# Patient Record
Sex: Female | Born: 1975
Health system: Southern US, Community
[De-identification: ages and names within clinical notes are randomized; demographics above are authoritative.]

## PROBLEM LIST (undated history)

## (undated) ENCOUNTER — Inpatient Hospital Stay (HOSPITAL_COMMUNITY): Payer: Self-pay

## (undated) DIAGNOSIS — F419 Anxiety disorder, unspecified: Secondary | ICD-10-CM

## (undated) DIAGNOSIS — F32A Depression, unspecified: Secondary | ICD-10-CM

## (undated) DIAGNOSIS — I10 Essential (primary) hypertension: Secondary | ICD-10-CM

## (undated) DIAGNOSIS — R7303 Prediabetes: Secondary | ICD-10-CM

## (undated) DIAGNOSIS — F329 Major depressive disorder, single episode, unspecified: Secondary | ICD-10-CM

## (undated) DIAGNOSIS — Z789 Other specified health status: Secondary | ICD-10-CM

## (undated) HISTORY — PX: BREAST REDUCTION SURGERY: SHX8

---
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
## 1998-06-09 ENCOUNTER — Other Ambulatory Visit: Admission: RE | Admit: 1998-06-09 | Discharge: 1998-06-09 | Payer: Self-pay | Admitting: *Deleted

## 2000-01-20 ENCOUNTER — Emergency Department (HOSPITAL_COMMUNITY): Admission: EM | Admit: 2000-01-20 | Discharge: 2000-01-20 | Payer: Self-pay | Admitting: Emergency Medicine

## 2004-05-02 ENCOUNTER — Ambulatory Visit (HOSPITAL_COMMUNITY): Admission: RE | Admit: 2004-05-02 | Discharge: 2004-05-02 | Payer: Self-pay | Admitting: Specialist

## 2004-05-02 ENCOUNTER — Ambulatory Visit (HOSPITAL_BASED_OUTPATIENT_CLINIC_OR_DEPARTMENT_OTHER): Admission: RE | Admit: 2004-05-02 | Discharge: 2004-05-02 | Payer: Self-pay | Admitting: Specialist

## 2005-11-22 ENCOUNTER — Ambulatory Visit: Payer: Self-pay | Admitting: Gynecology

## 2005-11-22 ENCOUNTER — Inpatient Hospital Stay (HOSPITAL_COMMUNITY): Admission: AD | Admit: 2005-11-22 | Discharge: 2005-11-22 | Payer: Self-pay | Admitting: Gynecology

## 2011-03-07 HISTORY — PX: LAPAROSCOPIC TUBAL LIGATION: SUR803

## 2011-08-16 ENCOUNTER — Inpatient Hospital Stay (HOSPITAL_COMMUNITY): Payer: 59

## 2011-08-16 ENCOUNTER — Inpatient Hospital Stay (HOSPITAL_COMMUNITY)
Admission: AD | Admit: 2011-08-16 | Discharge: 2011-08-16 | Disposition: A | Discharge status: Home / Self Care | Payer: 59 | Source: Ambulatory Visit | Attending: Obstetrics & Gynecology | Admitting: Obstetrics & Gynecology

## 2011-08-16 ENCOUNTER — Encounter (HOSPITAL_COMMUNITY): Payer: Self-pay | Admitting: *Deleted

## 2011-08-16 DIAGNOSIS — O0281 Inappropriate change in quantitative human chorionic gonadotropin (hCG) in early pregnancy: Secondary | ICD-10-CM

## 2011-08-16 DIAGNOSIS — O368390 Maternal care for abnormalities of the fetal heart rate or rhythm, unspecified trimester, not applicable or unspecified: Secondary | ICD-10-CM | POA: Insufficient documentation

## 2011-08-16 DIAGNOSIS — O280 Abnormal hematological finding on antenatal screening of mother: Secondary | ICD-10-CM

## 2011-08-16 DIAGNOSIS — R109 Unspecified abdominal pain: Secondary | ICD-10-CM | POA: Insufficient documentation

## 2011-08-16 DIAGNOSIS — Z3490 Encounter for supervision of normal pregnancy, unspecified, unspecified trimester: Secondary | ICD-10-CM

## 2011-08-16 HISTORY — DX: Other specified health status: Z78.9

## 2011-08-16 LAB — URINALYSIS, ROUTINE W REFLEX MICROSCOPIC
Bilirubin Urine: NEGATIVE
Glucose, UA: NEGATIVE mg/dL
Hgb urine dipstick: NEGATIVE
Ketones, ur: NEGATIVE mg/dL
Leukocytes, UA: NEGATIVE
Protein, ur: NEGATIVE mg/dL
pH: 6 (ref 5.0–8.0)

## 2011-08-16 NOTE — MAU Provider Note (Signed)
History     CSN: [id number]  Arrival date and time: 08/16/11 6213   First Provider Initiated Contact with Patient 08/16/11 2046      Chief Complaint  Patient presents with  . Abdominal Cramping   HPI Kaitlin Massey is 36 y.o. [id number] [redacted]w[redacted]d weeks presenting with concerns of decreasing BHCGs.  She is patient at [facility] in [place].  Was seen at their office today for repeat lab work.  BHCG 5/24 5,004, 2 days ago 5,003 and today 4800.  States she was told their was a heartbeat on the ultrasound 6/10 and not one today.  She is here for another opinion.  She is upset because they gave her a choice of D&C or "let nature take care of itself".  Denies vaginal bleeding. Having mild cramping.     Past Medical History  Diagnosis Date  . No pertinent past medical history     Past Surgical History  Procedure Date  . Breast reduction surgery     Family History  Problem Relation Age of Onset  . Hypertension Mother   . Diabetes Mother   . Stroke Mother   . Heart disease Mother   . Diabetes Father     History  Substance Use Topics  . Smoking status: Current Everyday Smoker -- 0.5 packs/day  . Smokeless tobacco: Not on file  . Alcohol Use: No    Allergies: No Known Allergies  Prescriptions prior to admission  Medication Sig Dispense Refill  . Prenatal Vit-Fe Fumarate-FA (PRENATAL MULTIVITAMIN) TABS Take 1 tablet by mouth daily.        Review of Systems  Constitutional: Negative for fever.  Respiratory: Negative.   Cardiovascular: Negative.   Gastrointestinal: Negative for abdominal pain.  Genitourinary:       Negative for bleeding   Physical Exam   Blood pressure 117/72, pulse 91, temperature 98.9 F (37.2 C), temperature source Oral, resp. rate 20, height 4' 11.5" (1.511 m), weight 84.369 kg (186 lb), SpO2 100.00%.  Physical Exam  Constitutional: She is oriented to person, place, and time. She appears well-developed and well-nourished.       Upset, tearful    HENT:  Head: Normocephalic.  Cardiovascular: Normal rate.   Respiratory: Effort normal.  Genitourinary:       Offered and declined by patient.  Neurological: She is alert and oriented to person, place, and time.  Skin: Skin is warm and dry.  Psychiatric: She has a normal mood and affect. Her behavior is normal. Thought content normal.   Results for orders placed during the hospital encounter of 08/16/11 (from the past 24 hour(s))  URINALYSIS, ROUTINE W REFLEX MICROSCOPIC     Status: Normal   Collection Time   08/16/11  7:52 PM      Component Value Range   Color, Urine YELLOW  YELLOW   APPearance CLEAR  CLEAR   Specific Gravity, Urine 1.010  1.005 - 1.030   pH 6.0  5.0 - 8.0   Glucose, UA NEGATIVE  NEGATIVE mg/dL   Hgb urine dipstick NEGATIVE  NEGATIVE   Bilirubin Urine NEGATIVE  NEGATIVE   Ketones, ur NEGATIVE  NEGATIVE mg/dL   Protein, ur NEGATIVE  NEGATIVE mg/dL   Urobilinogen, UA 0.2  0.0 - 1.0 mg/dL   Nitrite NEGATIVE  NEGATIVE   Leukocytes, UA NEGATIVE  NEGATIVE  POCT PREGNANCY, URINE     Status: Abnormal   Collection Time   08/16/11  8:01 PM      Component Value Range  Preg Test, Ur POSITIVE (*) NEGATIVE  Clinical Data: Cramping, decreasing beta HCG level.  OBSTETRIC <14 WK ULTRASOUND  Technique: Transabdominal ultrasound was performed for evaluation  of the gestation as well as the maternal uterus and adnexal  regions.  Comparison: None.  Intrauterine gestational sac: Visualized/normal in shape.  Yolk sac: Identified  Embryo: Not identified  Cardiac Activity: Not applicable  MSD: 12 mm 6w 0d  [place] EDC: 04/10/2012  Maternal uterus/Adnexae:  No subchorionic hemorrhage. Normal sonographic appearances to the  ovaries. Corpus luteal cyst noted on the left. No free fluid.  IMPRESSION:  Gestational sac with yolk sac. No embryo at this time. May be  secondary to the early timing of the examination however recommend  correlation with serial beta HCG and ultrasound  follow-up as  warranted as anembryonic pregnancy is not excluded.  Original Report Authenticated By: Waneta Martins,   MAU Course  Procedures  MDM Discussed at length the information given by the patient and the ultrasound findings tonight with the patient and her husband.   Explained that the numbers she gave are worrisome for a pregnancy that is not developing normally but the U/S tonight shows a GS with YS and that is assures [place] that the pregnancy is not in the fallopian tube.  Explained the expected progression of BHCG in normally developing embryos.  She declines exam because "I just wanted a second opinion if there was a heartbeat".  Explained embryo was not seen on ultrasound.  Assessment and Plan  A:  Non reassuring BHCGs at her doctor's office      IUGS without FP or Cardiac activity  P:  Encouraged patient to call her doctor's office in the am and schedule return appointment      Miscarriage precautions given.  Ikeya Brockel,EVE M 08/16/2011, 8:49 PM

## 2011-08-16 NOTE — Discharge Instructions (Signed)
Threatened Miscarriage  Bleeding during the first 20 weeks of pregnancy is common. This is sometimes called a threatened miscarriage. This is a pregnancy that is threatening to end before the twentieth week of pregnancy. Often this bleeding stops with bed rest or decreased activities as suggested by your caregiver and the pregnancy continues without any more problems. You may be asked to not have sexual intercourse, have orgasms or use tampons until further notice. Sometimes a threatened miscarriage can progress to a complete or incomplete miscarriage. This may or may not require further treatment. Some miscarriages occur before a woman misses a menstrual period and knows she is pregnant.  Miscarriages occur in 15 to 20% of all pregnancies and usually occur during the first 13 weeks of the pregnancy. The exact cause of a miscarriage is usually never known. A miscarriage is natures way of ending a pregnancy that is abnormal or would not make it to term. There are some things that may put you at risk to have a miscarriage, such as:   Hormone problems.   Infection of the uterus or cervix.   Chronic illness, diabetes for example, especially if it is not controlled.   Abnormal shaped uterus.   Fibroids in the uterus.   Incompetent cervix (the cervix is too weak to hold the baby).   Smoking.   Drinking too much alcohol. It's best not to drink any alcohol when you are pregnant.   Taking illegal drugs.  TREATMENT   When a miscarriage becomes complete and all products of conception (all the tissue in the uterus) have been passed, often no treatment is needed. If you think you passed tissue, save it in a container and take it to your doctor for evaluation. If the miscarriage is incomplete (parts of the fetus or placenta remain in the uterus), further treatment may be needed. The most common reason for further treatment is continued bleeding (hemorrhage) because pregnancy tissue did not pass out of the uterus. This  often occurs if a miscarriage is incomplete. Tissue left behind may also become infected. Treatment usually is dilatation and curettage (the removal of the remaining products of pregnancy. This can be done by a simple sucking procedure (suction curettage) or a simple scraping of the inside of the uterus. This may be done in the hospital or in the caregiver's office. This is only done when your caregiver knows that there is no chance for the pregnancy to proceed to term. This is determined by physical examination, negative pregnancy test, falling pregnancy hormone count and/or, an ultrasound revealing a dead fetus.  Miscarriages are often a very emotional time for prospective mothers and fathers. This is not you or your partners fault. It did not occur because of an inadequacy in you or your partner. Nearly all miscarriages occur because the pregnancy has started off wrongly. At least half of these pregnancies have a chromosomal abnormality. It is almost always not inherited. Others may have developmental problems with the fetus or placenta. This does not always show up even when the products miscarried are studied under the microscope. The miscarriage is nearly always not your fault and it is not likely that you could have prevented it from happening. If you are having emotional and grieving problems, talk to your health care provider and even seek counseling, if necessary, before getting pregnant again. You can begin trying for another pregnancy as soon as your caregiver says it is OK.  HOME CARE INSTRUCTIONS    Your caregiver may order   bed rest depending on how much bleeding and cramping you are having. You may be limited to only getting up to go to the bathroom. You may be allowed to continue light activity. You may need to make arrangements for the care of your other children and for any other responsibilities.   Keep track of the number of pads you use each day, how often you have to change pads and how  saturated (soaked) they are. Record this information.   DO NOT USE TAMPONS. Do not douche, have sexual intercourse or orgasms until approved by your caregiver.   You may receive a follow up appointment for re-evaluation of your pregnancy and a repeat blood test. Re-evaluation often occurs after 2 days and again in 4 to 6 weeks. It is very important that you follow-up in the recommended time period.   If you are Rh negative and the father is Rh positive or you do not know the fathers' blood type, you may receive a shot (Rh immune globulin) to help prevent abnormal antibodies that can develop and affect the baby in any future pregnancies.  SEEK IMMEDIATE MEDICAL CARE IF:   You have severe cramps in your stomach, back, or abdomen.   You have a sudden onset of severe pain in the lower part of your abdomen.   You develop chills.   You run an unexplained temperature of 101 F (38.3 C) or higher.   You pass large clots or tissue. Save any tissue for your caregiver to inspect.   Your bleeding increases or you become light-headed, weak, or have fainting episodes.   You have a gush of fluid from your vagina.   You pass out. This could mean you have a tubal (ectopic) pregnancy.  Document Released: 02/20/2005 Document Revised: 02/09/2011 Document Reviewed: 10/07/2007  [employer] Patient Information 2012 [employer].

## 2011-08-16 NOTE — MAU Note (Signed)
At md office  On Monday  , had u/s and BHCG , had heartbeat and HCG level of 5003, today HCG is 4800. Office called with results and options, pt wants to make sure there is no heartbeat at this time. Is having a lot of cramping, but no bleeding.

## 2011-08-16 NOTE — MAU Note (Signed)
Pt states she has had a positive pregnancy  Test and started having blood work to follow pregnancy because LMP and Utrasound were not matching up. Pt states she has continues to follow up with small rises in hormone levels, but was told by the utrasonographer that there was a heart beat. Pt states that she has been told by her private MD that she needs to make a decision about pregnancy. Pt not comfortable with results, because of heartbeat.

## 2011-09-01 ENCOUNTER — Ambulatory Visit: Payer: Self-pay | Admitting: Obstetrics and Gynecology

## 2011-09-01 LAB — CBC
HCT: 41.4 % (ref 35.0–47.0)
HGB: 13.8 g/dL (ref 12.0–16.0)

## 2013-07-31 IMAGING — US US OB COMP LESS 14 WK
1 series · 14 of 28 positions shown · non-contrast
Comparison: None.

CLINICAL DATA: Cramping, decreasing beta HCG level.

OBSTETRIC <14 WK ULTRASOUND
TECHNIQUE: Transabdominal ultrasound was performed for evaluation
of the gestation as well as the maternal uterus and adnexal
regions.

[Series 1: us ob comp less 14 wks · 14 of 50 slices shown]
[im 2/50]
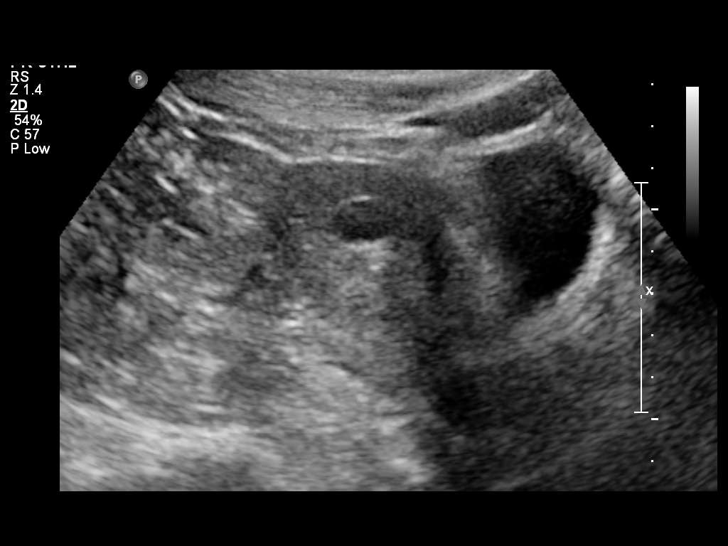
[im 6/50]
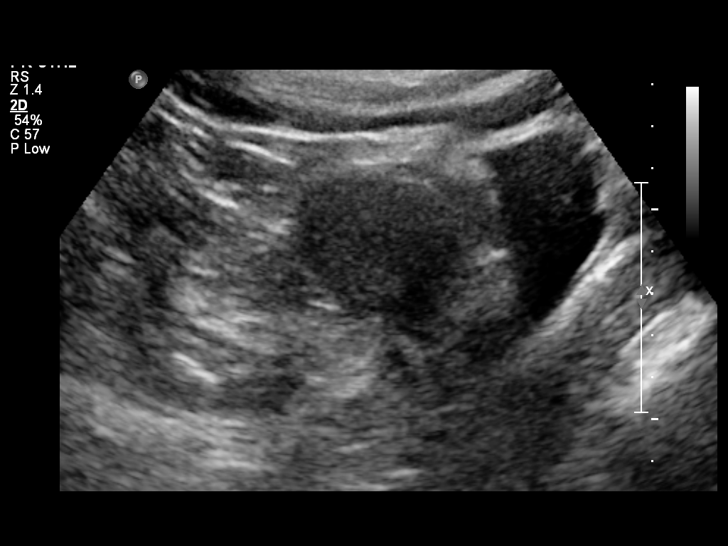
[im 10/50]
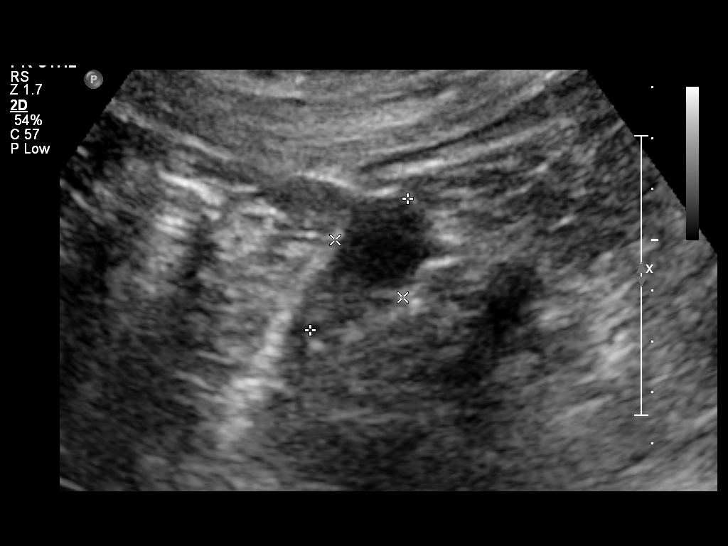
[im 13/50]
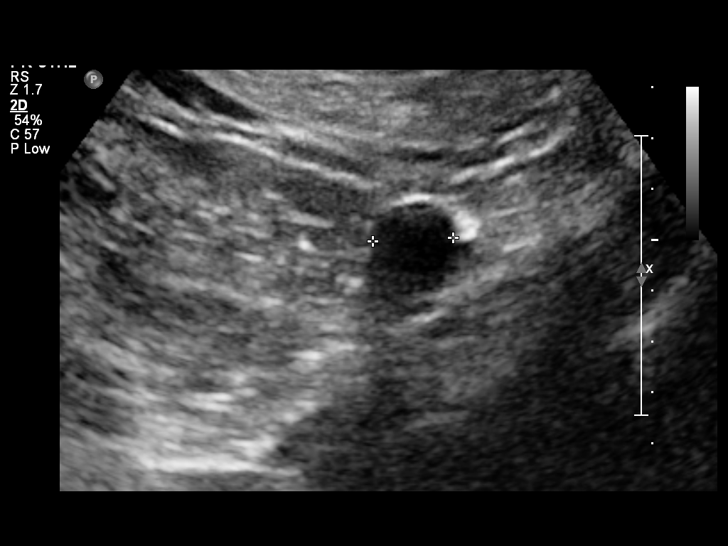
[im 17/50]
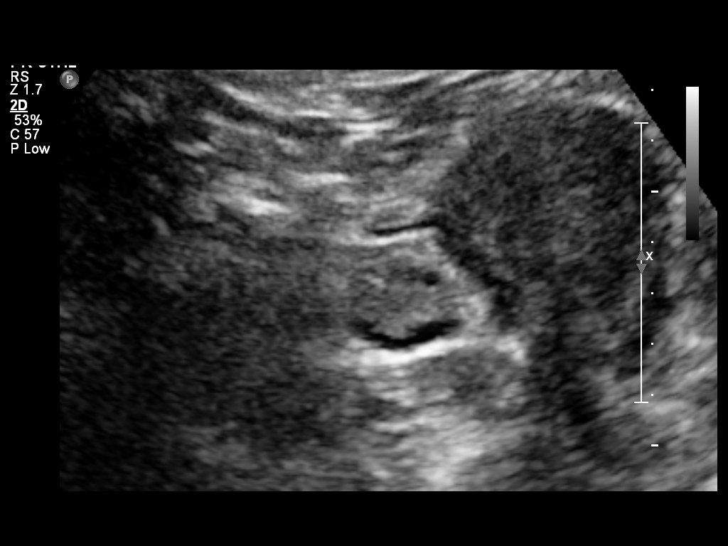
[im 20/50]
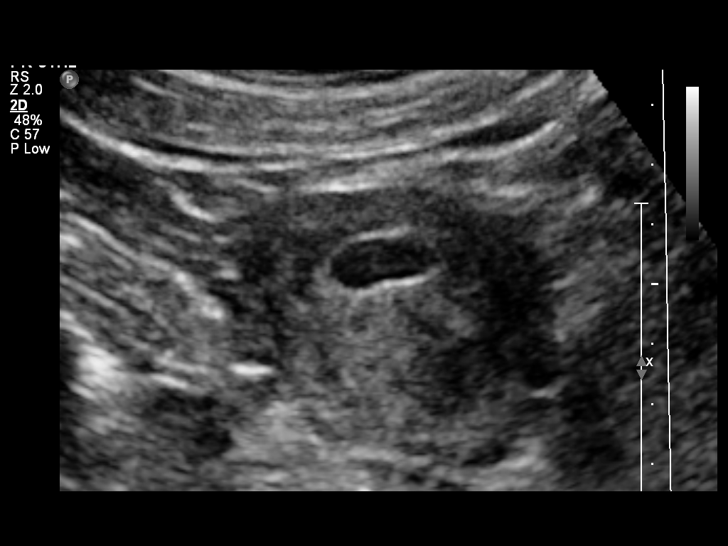
[im 24/50]
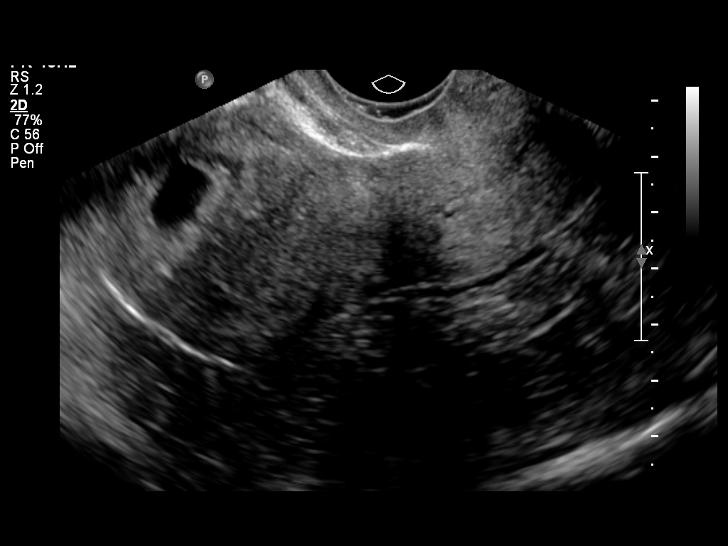
[im 28/50]
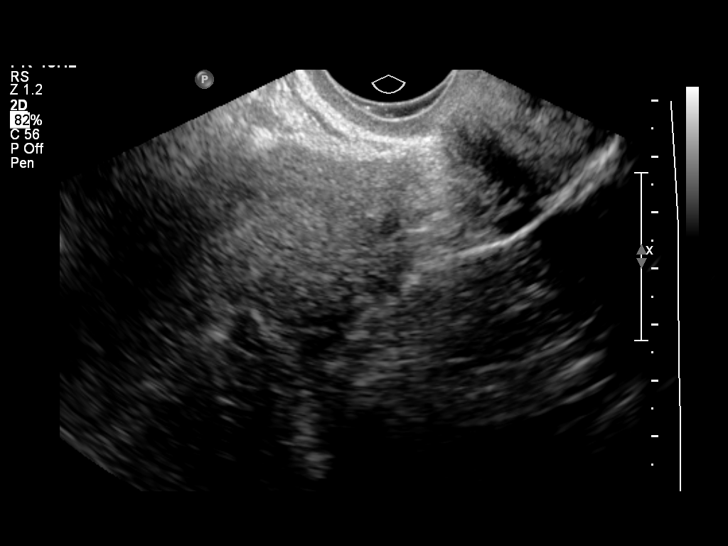
[im 31/50]
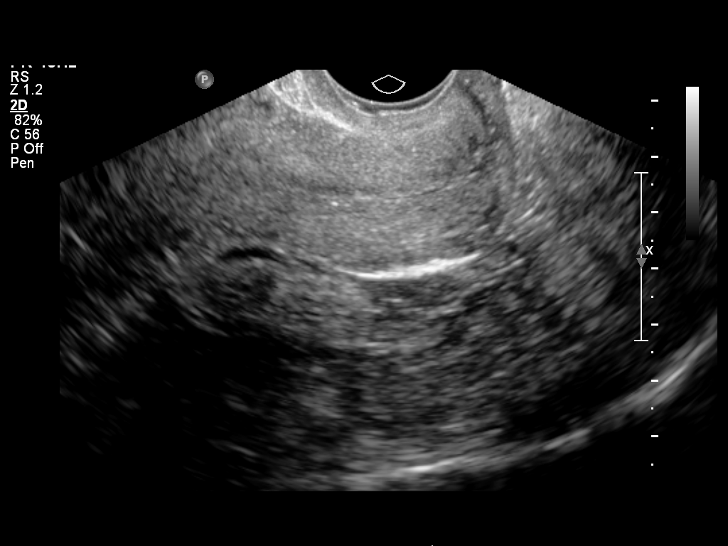
[im 35/50]
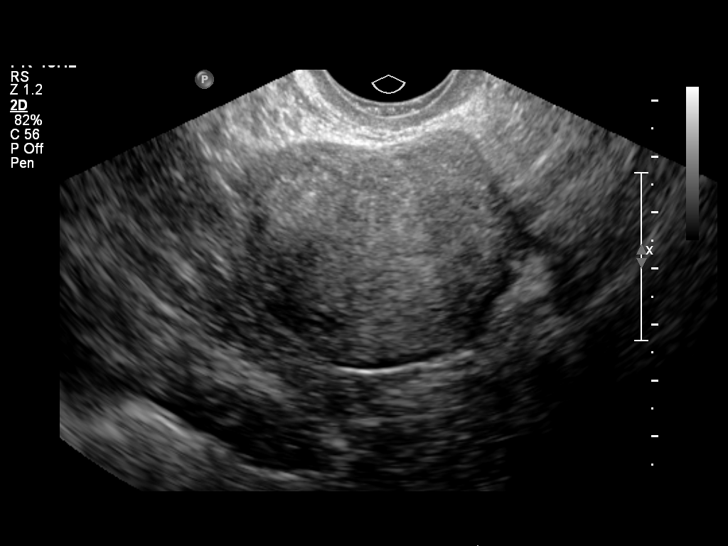
[im 39/50]
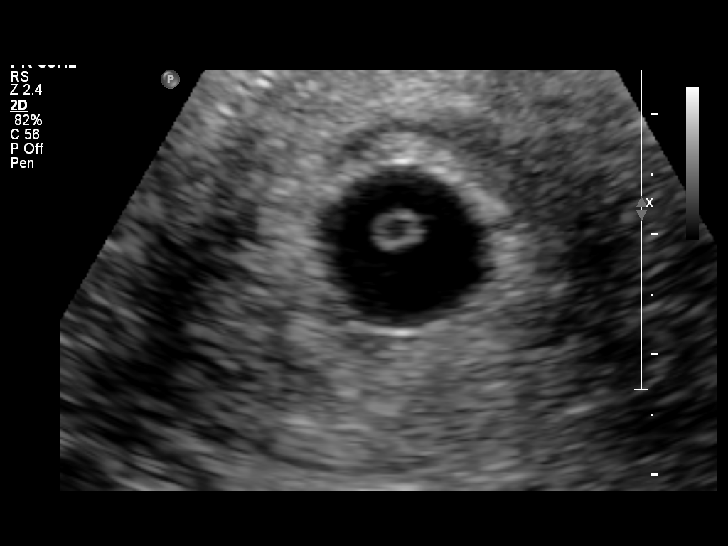
[im 42/50]
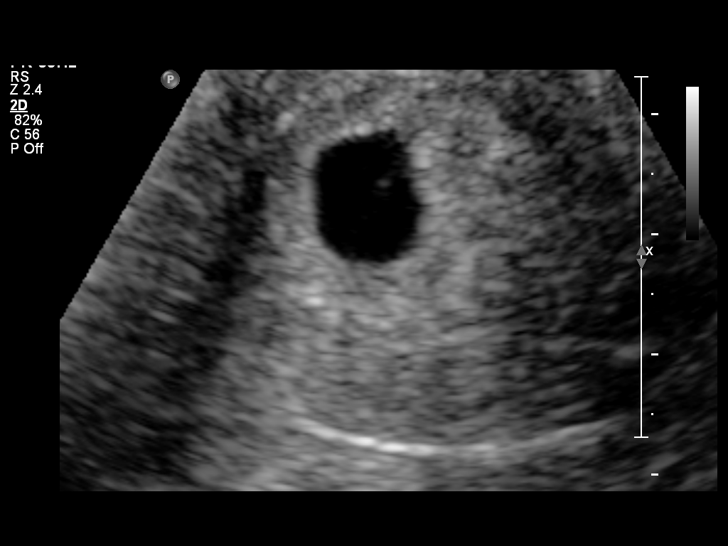
[im 46/50]
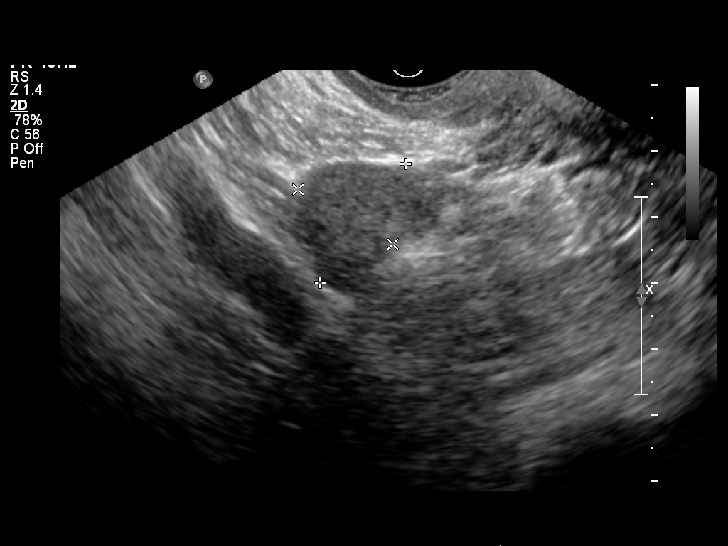
[im 50/50]
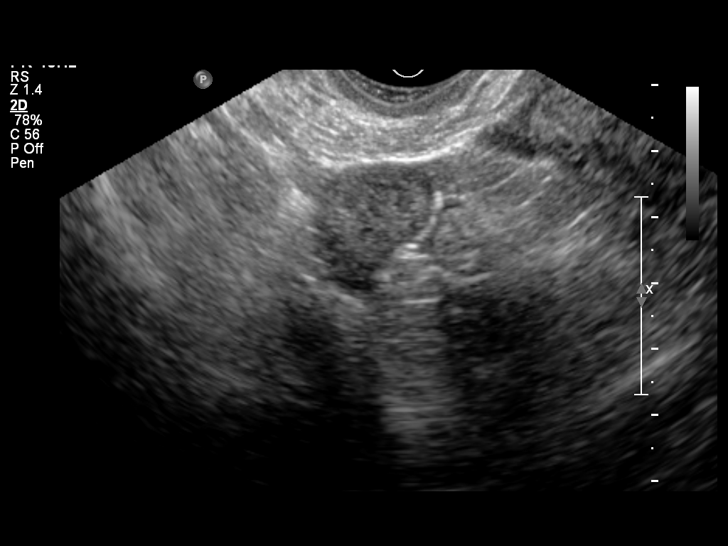

[14 of 28 positions shown; findings below may reference images not displayed]

Intrauterine gestational sac: Visualized/normal in shape.
Yolk sac: Identified
Embryo: Not identified
Cardiac Activity: Not applicable

MSD: 12 mm  6w  0d
US EDC: 04/10/2012

Maternal uterus/Adnexae:
No subchorionic hemorrhage.  Normal sonographic appearances to the
ovaries.  Corpus luteal cyst noted on the left.  No free fluid.
IMPRESSION: Gestational sac with yolk sac.  No embryo at this time.  May be
secondary to the early timing of the examination however recommend
correlation with serial beta HCG and ultrasound follow-up as
warranted as anembryonic pregnancy is not excluded.

## 2014-01-05 ENCOUNTER — Encounter (HOSPITAL_COMMUNITY): Payer: Self-pay | Admitting: *Deleted

## 2014-04-02 ENCOUNTER — Ambulatory Visit: Payer: Self-pay | Admitting: Obstetrics and Gynecology

## 2014-04-02 LAB — HEMOGLOBIN: HGB: 14.1 g/dL (ref 12.0–16.0)

## 2014-04-02 LAB — HEMATOCRIT: HCT: 42.7 % (ref 35.0–47.0)

## 2014-04-09 ENCOUNTER — Ambulatory Visit: Payer: Self-pay | Admitting: Obstetrics and Gynecology

## 2014-06-28 NOTE — Op Note (Signed)
PATIENT NAME:  Kaitlin Massey, Kaitlin Massey MR#:  [id number] DATE OF BIRTH:  1975/05/02  DATE OF PROCEDURE:  09/01/2011  PREOPERATIVE DIAGNOSIS: Incomplete abortion, desires sterility.   POSTOPERATIVE DIAGNOSIS:  Incomplete abortion, desires sterility.   PROCEDURE: Laparoscopic tubal sterilization with Falope rings, suction dilatation and curettage.   SURGEON: Senaida LangeLashawn Weaver-Lee, M.D.   ANESTHESIA: General.   ESTIMATED BLOOD LOSS: Minimal.  OPERATIVE FLUIDS: 1,200 mL.  COMPLICATIONS: None.   FINDINGS: Normal appearing uterus, tubes, and ovaries, products of conception.  SPECIMEN: Endometrial curettings.   INDICATIONS: The patient is a 39 year old who had been monitored for vaginal bleeding with positive pregnancy test, was subsequently found to not have a viable pregnancy. The day prior to admission for surgery she had some vaginal bleeding and desired surgical management. The patient also desired sterility. Risks, benefits, indications, and alternatives of the procedure were explained and informed consent was obtained.   PROCEDURE: The patient was taken to the Operating Room with IV fluids running. She was prepped and draped in the usual sterile fashion in [place] stirrups. A speculum was placed inside the vagina. The anterior lip of the cervix was grasped with a single-tooth tenaculum and a Hulka tenaculum was placed for uterine manipulation. The bladder was drained of urine. Attention was turned to the patient's abdomen where an infraumbilical region was injected with 0.5% Sensorcaine and a 5 mm incision was made. The Veress needle was placed and the abdomen was insufflated with CO2 gas. The Veress needle was removed and the abdomen was entered under direct visualization using a 5 mm X-Cel trocar. An additional 8 mm X-Cel trocar was placed in the patient's lateral left side. The Falope ring applicator was used to apply rings to the tubes bilaterally. This portion of the procedure was ended and all  instrumentation was removed from her abdomen. The incisions were closed with Dermabond.   Attention was turned to the patient's vagina where a speculum was placed. The Hulka tenaculum was removed. The uterus sounded to 8 cm. The cervix was dilated using Hank dilators. A #8 suction curette was placed and products of conception were removed. Additional products were removed via curettage with a serrated edge banjo curette. The procedure was ended. All instrumentation was removed from the patient's vagina. Sponge and instrument counts were correct x2. The patient was awakened and she was taken to recovery room in stable condition.   ____________________________ Sonda PrimesLashawn A. Patton SallesWeaver-Lee, MD law:ap D: 09/01/2011 19:27:01 ET T: 09/02/2011 09:51:47 ET JOB#: [id number]  cc: Flint MelterLashawn A. Patton SallesWeaver-Lee, MD, <Dictator> Sonda PrimesLASHAWN A WEAVER LEE MD ELECTRONICALLY SIGNED 09/04/2011 17:17

## 2014-06-29 LAB — SURGICAL PATHOLOGY

## 2014-07-05 NOTE — Op Note (Signed)
PATIENT NAME:  Kaitlin BertholdJENNINGS, Leonie M MR#:  [id number] DATE OF BIRTH:  September 03, 1975  DATE OF PROCEDURE:  04/09/2014  ATTENDING AND DICTATING PHYSICIAN: [place] Bingharlie Graceann Boileau, MD  PREOPERATIVE DIAGNOSES: Abnormal uterine bleeding, endometrial polyp.   POSTOPERATIVE DIAGNOSES:  1. Abnormal uterine bleeding. 2. Uterine septum.  3.  Endometrial polyp  OPERATION: Hysteroscopy, Dilation and Curettage   ANESTHESIA: General and paracervical block.   ESTIMATED BLOOD LOSS: 25 mL.   URINE OUTPUT: 50 mL.   Intravenous fluids: 800 mL of crystalloid.   COMPLICATIONS: Unable to do a NovaSure endometrial ablation due to a uterine septum.   VENOUS THROMBOEMBOLISM PROPHYLAXIS: SCDs applied to bilateral lower extremities.   ANTIBIOTICS: Not indicated.   SPECIMENS: Endometrial curettings to pathology.   FINDINGS: The uterus sounded to 8.5 cm. The cervical canal length was 4.5 and the uterine cavity length was 4 cm. On diagnostic hysteroscopy, a uterine septum was seen. It appeared to be approximately 1 cm in the medial and midline portion of the uterus at the fundus. The right tubal ostia was then seen. The left tubal ostia was not definitively seen. Endometrial polyp was noted near the septum, as well as a fluffy and polypoid benign-appearing material in the endometrial cavity with a normal endocervical canal appeared. I would NOT recommend an IUD for symptomatic treatment and this was conveyed to the patient post operatively.  DESCRIPTION OF PROCEDURE: The patient was taken to the Operating Room, where the anesthesia was administered. She was prepped and draped in normal sterile fashion in dorsal lithotomy position in [place] stirrups. The bladder was then drained with an in and out Foley catheter. The speculum was then inserted and a single-toothed tenaculum applied and the endocervical canal measured to 4.5 cm and the uterine cavity length measured to 4 cm. Next, the canal was then gradually dilated up to 6 mm and  the MyoSure hysteroscope was then introduced with the above-noted findings. A dilation and curettage was then performed. Next, the NovaSure was then opened outside of the body with normal checkpoints and no malfunctions. Then the NovaSure was then retracted back into the device and inserted into the uterine cavity and opened, and going through the normal instructions, the cavity width was not able to get past 2.5 cm. Next, the NovaSure was then retracted back into the device and removed from the patient. Hysteroscope was then performed with similar above-noted findings and another D and C was then performed to try to get rid of more of the fluffy endometrial material, and the NovaSure was then reinserted into the uterus and opened, but unfortunately, unable to get past 2.5 cm, which was felt to be due to uterine septum. Given the fact the patient does not desire any more children and had a bilateral tubal, and it was felt that the endometrial polyp was causing or abnormal uterine bleeding, the decision was made to abort procedure and not to resect the septum.   All instruments were removed from the patient after excellent hemostasis was noted. Sponge, lap, needle, and instrument counts were correct x2.   ____________________________ [place] Bingharlie Inioluwa Boulay, MD cp:ap D: 04/11/2014 09:05:52 ET T: 04/11/2014 11:52:49 ET JOB#: [id number]  cc: [place] Bingharlie Ayline Dingus, MD, <Dictator> [place] BingHARLIE Kyriaki Moder MD ELECTRONICALLY SIGNED 04/26/2014 12:29

## 2016-01-07 ENCOUNTER — Encounter (HOSPITAL_BASED_OUTPATIENT_CLINIC_OR_DEPARTMENT_OTHER): Payer: Self-pay

## 2016-01-07 ENCOUNTER — Ambulatory Visit (HOSPITAL_BASED_OUTPATIENT_CLINIC_OR_DEPARTMENT_OTHER): Admit: 2016-01-07 | Payer: Self-pay | Admitting: Orthopedic Surgery

## 2016-01-07 SURGERY — ARTHROSCOPY, KNEE
Anesthesia: Choice | Laterality: Right

## 2017-07-05 DIAGNOSIS — E039 Hypothyroidism, unspecified: Secondary | ICD-10-CM | POA: Diagnosis not present

## 2017-07-05 DIAGNOSIS — E7800 Pure hypercholesterolemia, unspecified: Secondary | ICD-10-CM | POA: Diagnosis not present

## 2017-07-05 DIAGNOSIS — S30860A Insect bite (nonvenomous) of lower back and pelvis, initial encounter: Secondary | ICD-10-CM | POA: Diagnosis not present

## 2017-07-05 DIAGNOSIS — L299 Pruritus, unspecified: Secondary | ICD-10-CM | POA: Diagnosis not present

## 2017-07-05 DIAGNOSIS — Z139 Encounter for screening, unspecified: Secondary | ICD-10-CM | POA: Diagnosis not present

## 2017-07-05 DIAGNOSIS — E119 Type 2 diabetes mellitus without complications: Secondary | ICD-10-CM | POA: Diagnosis not present

## 2018-10-24 DIAGNOSIS — Z6841 Body Mass Index (BMI) 40.0 and over, adult: Secondary | ICD-10-CM | POA: Diagnosis not present

## 2018-10-24 DIAGNOSIS — Z1331 Encounter for screening for depression: Secondary | ICD-10-CM | POA: Diagnosis not present

## 2018-10-24 DIAGNOSIS — F418 Other specified anxiety disorders: Secondary | ICD-10-CM | POA: Diagnosis not present

## 2018-10-24 DIAGNOSIS — Z1231 Encounter for screening mammogram for malignant neoplasm of breast: Secondary | ICD-10-CM | POA: Diagnosis not present

## 2019-01-24 DIAGNOSIS — F418 Other specified anxiety disorders: Secondary | ICD-10-CM | POA: Diagnosis not present

## 2019-01-24 DIAGNOSIS — Z79899 Other long term (current) drug therapy: Secondary | ICD-10-CM | POA: Diagnosis not present

## 2019-01-24 DIAGNOSIS — Z87891 Personal history of nicotine dependence: Secondary | ICD-10-CM | POA: Diagnosis not present

## 2019-01-24 DIAGNOSIS — I10 Essential (primary) hypertension: Secondary | ICD-10-CM | POA: Diagnosis not present

## 2019-05-29 DIAGNOSIS — F419 Anxiety disorder, unspecified: Secondary | ICD-10-CM | POA: Diagnosis not present

## 2019-05-29 DIAGNOSIS — Z13220 Encounter for screening for lipoid disorders: Secondary | ICD-10-CM | POA: Diagnosis not present

## 2019-05-29 DIAGNOSIS — Z87891 Personal history of nicotine dependence: Secondary | ICD-10-CM | POA: Diagnosis not present

## 2019-05-29 DIAGNOSIS — Z0000 Encounter for general adult medical examination without abnormal findings: Secondary | ICD-10-CM | POA: Diagnosis not present

## 2019-05-29 DIAGNOSIS — Z6837 Body mass index (BMI) 37.0-37.9, adult: Secondary | ICD-10-CM | POA: Diagnosis not present

## 2019-05-29 DIAGNOSIS — B977 Papillomavirus as the cause of diseases classified elsewhere: Secondary | ICD-10-CM | POA: Diagnosis not present

## 2019-07-01 DIAGNOSIS — Z6841 Body Mass Index (BMI) 40.0 and over, adult: Secondary | ICD-10-CM | POA: Diagnosis not present

## 2019-07-01 DIAGNOSIS — N939 Abnormal uterine and vaginal bleeding, unspecified: Secondary | ICD-10-CM | POA: Diagnosis not present

## 2019-07-25 DIAGNOSIS — Z6841 Body Mass Index (BMI) 40.0 and over, adult: Secondary | ICD-10-CM | POA: Diagnosis not present

## 2019-07-25 DIAGNOSIS — E669 Obesity, unspecified: Secondary | ICD-10-CM | POA: Diagnosis not present

## 2019-07-25 DIAGNOSIS — N939 Abnormal uterine and vaginal bleeding, unspecified: Secondary | ICD-10-CM | POA: Diagnosis not present

## 2019-08-28 DIAGNOSIS — F419 Anxiety disorder, unspecified: Secondary | ICD-10-CM | POA: Diagnosis not present

## 2019-08-28 DIAGNOSIS — E7800 Pure hypercholesterolemia, unspecified: Secondary | ICD-10-CM | POA: Diagnosis not present

## 2019-08-28 DIAGNOSIS — Z139 Encounter for screening, unspecified: Secondary | ICD-10-CM | POA: Diagnosis not present

## 2019-08-28 DIAGNOSIS — I10 Essential (primary) hypertension: Secondary | ICD-10-CM | POA: Diagnosis not present

## 2019-09-02 DIAGNOSIS — Z79899 Other long term (current) drug therapy: Secondary | ICD-10-CM | POA: Diagnosis not present

## 2019-09-02 DIAGNOSIS — D519 Vitamin B12 deficiency anemia, unspecified: Secondary | ICD-10-CM | POA: Diagnosis not present

## 2019-09-02 DIAGNOSIS — Z6841 Body Mass Index (BMI) 40.0 and over, adult: Secondary | ICD-10-CM | POA: Diagnosis not present

## 2019-09-23 ENCOUNTER — Encounter (HOSPITAL_BASED_OUTPATIENT_CLINIC_OR_DEPARTMENT_OTHER): Payer: Self-pay | Admitting: Obstetrics & Gynecology

## 2019-09-23 NOTE — Progress Notes (Addendum)
COVID Vaccine Completed:None Date COVID Vaccine completed: COVID vaccine manufacturer: Pfizer    [facility]   PCP - Marin Comment, PA ([facility]) Cardiologist - N/A No stimulator in back  Chest x-ray -  EKG -  Stress Test -  ECHO -  Cardiac Cath -   Sleep Study -  CPAP -   Fasting Blood Sugar -  Checks Blood Sugar _____ times a day  Blood Thinner Instructions: Aspirin Instructions: Last Dose:  Complete ADL w/o SOB  Anesthesia review:   Patient denies shortness of breath, fever, cough and chest pain at PAT appointment   Patient verbalized understanding of instructions that were given to them at the PAT appointment. Patient was also instructed that they will need to review over the PAT instructions again at home before surgery.

## 2019-09-23 NOTE — H&P (Signed)
Kaitlin Massey [id number] h/o Laparoscopic BTL, Depression/anxiety, newly diagnosed HTN, prediabetes (diet management currently) tele visit on 7/20 for preop visit. Unable to be seen in person today due to work schedule, opted for tele visit.  Reports bleeding is still irregular - at least 2 periods a month with lots of cramping. Not taking progesterone, previously tried norethindrone without relief. Continues to have leakage with coughing/sneezing. Has not tried azo bladder control.  Workup: Pap smear: 05/29/2019 NILM, HPV HR positive, HPV 16/18/45 Negative   [facility] US 07/25/2019 [place] for AUB: Anteverted Uterus 7.71x4.63x4.17cm, endometrium 30mm and normal morphology, uterus heterogenous, echogenic area in myometrium consistent with adenomyosis. Bilateral ovaries/adnexa wnl.  Endometrial biopsy 07/25/2019 No hyperplasia or carcinoma. Inactive endometrium  07/25/2019 A1c 5.8%    Past Medical History:  Diagnosis Date  . Anxiety   . Depression   . Hypertension   . Prediabetes     Past Surgical History:  Procedure Laterality Date  . BREAST REDUCTION SURGERY    . LAPAROSCOPIC TUBAL LIGATION  2013    Family History  Problem Relation Age of Onset  . Hypertension Mother   . Diabetes Mother   . Stroke Mother   . Heart disease Mother   . Diabetes Father     Social History:  reports that she has been smoking. She has been smoking about 0.50 packs per day. She does not have any smokeless tobacco history on file. She reports that she does not drink alcohol and does not use drugs.  Allergies:  Allergies  Allergen Reactions  . Morphine Anaphylaxis    No medications prior to admission.    Review of Systems - per HPI Physical Exam Deferred for tele appointment.   Assessment/Plan: 44yo with AUB/SUI, plan for TVH/BS/TVT scheduled 10/07/2019 The nature of the procedure was discussed with the patient in detail. An informed discussion was held regarding the risks and  benefits of surgical intervention. Specifically, the patient was apprised of risks of pain, bleeding requiring blood transfusion, infection requiring antibiotics, injury to nearby organs (bowel, bladder, nerves, blood vessels, ureter), need for laparotomy to complete the operation, or failure to achieve desired results. She was informed of the low but real risk of these complications, and understands that the alternative is no surgery. An opportunity to ask questions was provided, and all questions were answered to the patient's satisfaction. Patient expresses understanding of these issues, and agrees to proceed with the plan outlined above. The expected post-operative recovery course was discussed with the patient, and post-operative instructions were reviewed. -Surgical history significant for Laparoscopic BTL at same time of D&C, miscarriage 2013 - does not know providers name.  -History of SVD x2, largest 7lb8oz -Would accept blood transfusion if necessary -Reviewed ERAS protocol -Plan to d/c home with catheter and f/u 2 days later for voiding trial.  2. Co-morbidities: Anxiety- takes alprazolam QD to BID Depression- takes sertraline QD Encourage smoking cessation(reports she has cut back) HTN and pre diabetes: saw PCP, working on weight loss by diet/exercise, prior to starting medications  Kaitlin Massey 09/23/2019, 3:11 PM

## 2019-09-23 NOTE — Patient Instructions (Addendum)
YOU ARE SCHEDULED FOR A COVID TEST 10-03-19 @ 3:15 PM. THIS TEST MUST BE DONE BEFORE SURGERY. GO TO  [address], [place], [id number] AND REMAIN IN YOUR CAR, THIS IS A DRIVE UP TEST. ONCE YOUR COVID TEST IS DONE PLEASE FOLLOW ALL THE QUARANTINE  INSTRUCTIONS GIVEN IN YOUR HANDOUT.      Your procedure is scheduled on 10-07-19   [facility]   Call this number if you have problems the morning of surgery  :[phone].   OUR ADDRESS IS [address].  WE ARE LOCATED IN [facility].  PLEASE BRING YOUR INSURANCE CARD AND PHOTO ID DAY OF SURGERY.  ONLY ONE PERSON ALLOWED IN FACILITY WAITING AREA.                                     REMEMBER: AFTER MIDNIGHT, YOU MAY HAVE A CLEAR LIQUID DIET UNTIL 9:15 am. AFTER 9:15 AM, NOTHING UNTIL AFTER SURGERY.   CLEAR LIQUID DIET   Foods Allowed                                                                     Foods Excluded  Coffee and tea, regular and decaf                             liquids that you cannot  Plain Jell-O any favor except red or purple                                           see through such as: Fruit ices (not with fruit pulp)                                     milk, soups, orange juice  Iced Popsicles                                    All solid food Carbonated beverages, regular and diet                                    Cranberry, grape and apple juices Sports drinks like Gatorade Lightly seasoned clear broth or consume(fat free) Sugar, honey syrup   _____________________________________________________________________      YOU MAY  BRUSH YOUR TEETH MORNING OF SURGERY AND RINSE YOUR MOUTH OUT, NO CHEWING GUM CANDY OR MINTS.   TAKE THESE MEDICATIONS MORNING OF SURGERY WITH A SIP OF WATER: Sertraline (Zoloft), and Alprazolam (Xanax)  IF YOU ARE SPENDING THE NIGHT AFTER SURGERY PLEASE BRING ALL YOUR PRESCRIPTION MEDICATIONS IN THEIR ORIGINAL  BOTTLES.   1 VISITOR IS ALLOWED IN WAITING ROOM ONLY DAY OF SURGERY.   NO VISITOR MAY SPEND THE NIGHT. VISITOR ARE ALLOWED TO  STAY UNTIL 800 PM.                                    DO NOT WEAR JEWERLY, MAKE UP, OR NAIL POLISH ON FINGERNAILS.  DO NOT WEAR LOTIONS, POWDERS, PERFUMES OR DEODORANT.  DO NOT SHAVE FOR 24 HOURS PRIOR TO DAY OF SURGERY.  CONTACTS, GLASSES, OR DENTURES MAY NOT BE WORN TO SURGERY.                                    [facility] IS NOT RESPONSIBLE  FOR ANY BELONGINGS.                                                                    Marland Kitchen                                      [facility] - Preparing for Surgery Before surgery, you can play an important role.  Because skin is not sterile, your skin needs to be as free of germs as possible.  You can reduce the number of germs on your skin by washing with CHG (chlorahexidine gluconate) soap before surgery.  CHG is an antiseptic cleaner which kills germs and bonds with the skin to continue killing germs even after washing. Please DO NOT use if you have an allergy to CHG or antibacterial soaps.  If your skin becomes reddened/irritated stop using the CHG and inform your nurse when you arrive at Short Stay. Do not shave (including legs and underarms) for at least 48 hours prior to the first CHG shower.  You may shave your face/neck. Please follow these instructions carefully:  1.  Shower with CHG Soap the night before surgery and the  morning of Surgery.  2.  If you choose to wash your hair, wash your hair first as usual with your  normal  shampoo.  3.  After you shampoo, rinse your hair and body thoroughly to remove the  shampoo.                           4.  Use CHG as you would any other liquid soap.  You can apply chg directly  to the skin and wash                       Gently with a scrungie or clean washcloth.  5.  Apply the CHG Soap to your body ONLY FROM THE NECK DOWN.   Do not use on face/ open                            Wound or open sores. Avoid contact with eyes, ears mouth and genitals (private parts).                       Wash face,  Genitals (private parts) with your normal soap.  6.  Wash thoroughly, paying special attention to the area where your surgery  will be performed.  7.  Thoroughly rinse your body with warm water from the neck down.  8.  DO NOT shower/wash with your normal soap after using and rinsing off  the CHG Soap.                9.  Pat yourself dry with a clean towel.            10.  Wear clean pajamas.            11.  Place clean sheets on your bed the night of your first shower and do not  sleep with pets. Day of Surgery : Do not apply any lotions/deodorants the morning of surgery.  Please wear clean clothes to the hospital/surgery center.  FAILURE TO FOLLOW THESE INSTRUCTIONS MAY RESULT IN THE CANCELLATION OF YOUR SURGERY PATIENT SIGNATURE_________________________________  NURSE SIGNATURE__________________________________  ________________________________________________________________________  WHAT IS A BLOOD TRANSFUSION? Blood Transfusion Information  A transfusion is the replacement of blood or some of its parts. Blood is made up of multiple cells which provide different functions.  Red blood cells carry oxygen and are used for blood loss replacement.  White blood cells fight against infection.  Platelets control bleeding.  Plasma helps clot blood.  Other blood products are available for specialized needs, such as hemophilia or other clotting disorders. BEFORE THE TRANSFUSION  Who gives blood for transfusions?   Healthy volunteers who are fully evaluated to make sure their blood is safe. This is blood bank blood. Transfusion therapy is the safest it has ever been in the practice of medicine. Before blood is taken from a donor, a complete history is taken to make sure that person has no history of diseases nor engages in risky social behavior (examples are  intravenous drug use or sexual activity with multiple partners). The donor's travel history is screened to minimize risk of transmitting infections, such as malaria. The donated blood is tested for signs of infectious diseases, such as HIV and hepatitis. The blood is then tested to be sure it is compatible with you in order to minimize the chance of a transfusion reaction. If you or a relative donates blood, this is often done in anticipation of surgery and is not appropriate for emergency situations. It takes many days to process the donated blood. RISKS AND COMPLICATIONS Although transfusion therapy is very safe and saves many lives, the main dangers of transfusion include:   Getting an infectious disease.  Developing a transfusion reaction. This is an allergic reaction to something in the blood you were given. Every precaution is taken to prevent this. The decision to have a blood transfusion has been considered carefully by your caregiver before blood is given. Blood is not given unless the benefits outweigh the risks. AFTER THE TRANSFUSION  Right after receiving a blood transfusion, you will usually feel much better and more energetic. This is especially true if your red blood cells have gotten low (anemic). The transfusion raises the level of the red blood cells which carry oxygen, and this usually causes an energy increase.  The nurse administering the transfusion will monitor you carefully for complications. HOME CARE INSTRUCTIONS  No special instructions are needed after a transfusion. You may find your energy is better. Speak with your caregiver about any limitations on activity for underlying diseases you may have. SEEK MEDICAL CARE IF:   Your condition is not improving after your transfusion.  You develop redness or irritation at the intravenous (IV) site. SEEK IMMEDIATE MEDICAL CARE IF:  Any of the following symptoms occur over the next 12 hours:  Shaking chills.  You have a  temperature by mouth above 102 F (38.9 C), not controlled by medicine.  Chest, back, or muscle pain.  People around you feel you are not acting correctly or are confused.  Shortness of breath or difficulty breathing.  Dizziness and fainting.  You get a rash or develop hives.  You have a decrease in urine output.  Your urine turns a dark color or changes to pink, red, or brown. Any of the following symptoms occur over the next 10 days:  You have a temperature by mouth above 102 F (38.9 C), not controlled by medicine.  Shortness of breath.  Weakness after normal activity.  The white part of the eye turns yellow (jaundice).  You have a decrease in the amount of urine or are urinating less often.  Your urine turns a dark color or changes to pink, red, or brown. Document Released: 02/18/2000 Document Revised: 05/15/2011 Document Reviewed: 10/07/2007 [facility] Patient Information 2014 [place], [place].  _______________________________________________________________________

## 2019-09-26 ENCOUNTER — Encounter (HOSPITAL_COMMUNITY): Payer: Self-pay | Admitting: *Deleted

## 2019-09-26 ENCOUNTER — Other Ambulatory Visit: Payer: Self-pay

## 2019-09-26 ENCOUNTER — Encounter (HOSPITAL_COMMUNITY)
Admission: RE | Admit: 2019-09-26 | Discharge: 2019-09-26 | Disposition: A | Discharge status: Home / Self Care | Payer: BC Managed Care – PPO | Source: Ambulatory Visit | Attending: Obstetrics & Gynecology | Admitting: Obstetrics & Gynecology

## 2019-10-03 ENCOUNTER — Other Ambulatory Visit (HOSPITAL_COMMUNITY)
Admission: RE | Admit: 2019-10-03 | Discharge: 2019-10-03 | Disposition: A | Discharge status: Home / Self Care | Payer: BC Managed Care – PPO | Source: Ambulatory Visit | Attending: Obstetrics & Gynecology | Admitting: Obstetrics & Gynecology

## 2019-10-03 ENCOUNTER — Encounter (HOSPITAL_COMMUNITY)
Admission: RE | Admit: 2019-10-03 | Discharge: 2019-10-03 | Disposition: A | Discharge status: Home / Self Care | Payer: BC Managed Care – PPO | Source: Ambulatory Visit | Attending: Obstetrics & Gynecology | Admitting: Obstetrics & Gynecology

## 2019-10-03 ENCOUNTER — Other Ambulatory Visit: Payer: Self-pay

## 2019-10-03 DIAGNOSIS — Z01812 Encounter for preprocedural laboratory examination: Secondary | ICD-10-CM | POA: Insufficient documentation

## 2019-10-03 DIAGNOSIS — Z20822 Contact with and (suspected) exposure to covid-19: Secondary | ICD-10-CM | POA: Diagnosis not present

## 2019-10-03 LAB — CBC
HCT: 41 % (ref 36.0–46.0)
Hemoglobin: 13.3 g/dL (ref 12.0–15.0)
MCH: 27.1 pg (ref 26.0–34.0)
MCHC: 32.4 g/dL (ref 30.0–36.0)
MCV: 83.7 fL (ref 80.0–100.0)
Platelets: 336 10*3/uL (ref 150–400)
RBC: 4.9 MIL/uL (ref 3.87–5.11)
RDW: 13.7 % (ref 11.5–15.5)
WBC: 11.5 10*3/uL — ABNORMAL HIGH (ref 4.0–10.5)
nRBC: 0 % (ref 0.0–0.2)

## 2019-10-03 LAB — BASIC METABOLIC PANEL
Anion gap: 11 (ref 5–15)
BUN: 12 mg/dL (ref 6–20)
CO2: 23 mmol/L (ref 22–32)
Calcium: 8.8 mg/dL — ABNORMAL LOW (ref 8.9–10.3)
Chloride: 103 mmol/L (ref 98–111)
Creatinine, Ser: 0.72 mg/dL (ref 0.44–1.00)
GFR calc Af Amer: >60 mL/min (ref >60)
GFR calc non Af Amer: >60 mL/min (ref >60)
Glucose, Bld: 87 mg/dL (ref 70–99)
Potassium: 3.8 mmol/L (ref 3.5–5.1)
Sodium: 137 mmol/L (ref 135–145)

## 2019-10-03 LAB — SARS CORONAVIRUS 2 (TAT 6-24 HRS): SARS Coronavirus 2: NEGATIVE

## 2019-10-03 MED ORDER — ENSURE PRE-SURGERY PO LIQD
296.0000 mL | Freq: Once | ORAL | Status: DC
  Filled 2019-10-03: qty 296

## 2019-10-04 LAB — HEMOGLOBIN A1C
Hgb A1c MFr Bld: 5.8 % — ABNORMAL HIGH (ref 4.8–5.6)
Mean Plasma Glucose: 119.76 mg/dL

## 2019-10-07 ENCOUNTER — Encounter (HOSPITAL_BASED_OUTPATIENT_CLINIC_OR_DEPARTMENT_OTHER)
Admission: RE | Disposition: A | Discharge status: Home / Self Care | Payer: Self-pay | Source: Ambulatory Visit | Attending: Obstetrics & Gynecology

## 2019-10-07 ENCOUNTER — Encounter (HOSPITAL_BASED_OUTPATIENT_CLINIC_OR_DEPARTMENT_OTHER): Payer: Self-pay | Admitting: Obstetrics & Gynecology

## 2019-10-07 ENCOUNTER — Other Ambulatory Visit: Payer: Self-pay

## 2019-10-07 ENCOUNTER — Ambulatory Visit (HOSPITAL_BASED_OUTPATIENT_CLINIC_OR_DEPARTMENT_OTHER): Payer: BC Managed Care – PPO | Admitting: Anesthesiology

## 2019-10-07 ENCOUNTER — Ambulatory Visit (HOSPITAL_BASED_OUTPATIENT_CLINIC_OR_DEPARTMENT_OTHER)
Admission: RE | Admit: 2019-10-07 | Discharge: 2019-10-08 | Disposition: A | Discharge status: Home / Self Care | Payer: BC Managed Care – PPO | Source: Ambulatory Visit | Attending: Obstetrics & Gynecology | Admitting: Obstetrics & Gynecology

## 2019-10-07 DIAGNOSIS — F419 Anxiety disorder, unspecified: Secondary | ICD-10-CM | POA: Insufficient documentation

## 2019-10-07 DIAGNOSIS — Z885 Allergy status to narcotic agent status: Secondary | ICD-10-CM | POA: Insufficient documentation

## 2019-10-07 DIAGNOSIS — N393 Stress incontinence (female) (male): Secondary | ICD-10-CM | POA: Insufficient documentation

## 2019-10-07 DIAGNOSIS — F329 Major depressive disorder, single episode, unspecified: Secondary | ICD-10-CM | POA: Insufficient documentation

## 2019-10-07 DIAGNOSIS — F17210 Nicotine dependence, cigarettes, uncomplicated: Secondary | ICD-10-CM | POA: Insufficient documentation

## 2019-10-07 DIAGNOSIS — R7303 Prediabetes: Secondary | ICD-10-CM | POA: Insufficient documentation

## 2019-10-07 DIAGNOSIS — N870 Mild cervical dysplasia: Secondary | ICD-10-CM | POA: Insufficient documentation

## 2019-10-07 DIAGNOSIS — Z90710 Acquired absence of both cervix and uterus: Secondary | ICD-10-CM | POA: Diagnosis present

## 2019-10-07 DIAGNOSIS — N800 Endometriosis of uterus: Secondary | ICD-10-CM | POA: Insufficient documentation

## 2019-10-07 DIAGNOSIS — Z6841 Body Mass Index (BMI) 40.0 and over, adult: Secondary | ICD-10-CM | POA: Insufficient documentation

## 2019-10-07 DIAGNOSIS — N939 Abnormal uterine and vaginal bleeding, unspecified: Secondary | ICD-10-CM | POA: Diagnosis present

## 2019-10-07 DIAGNOSIS — I10 Essential (primary) hypertension: Secondary | ICD-10-CM | POA: Insufficient documentation

## 2019-10-07 HISTORY — DX: Depression, unspecified: F32.A

## 2019-10-07 HISTORY — DX: Prediabetes: R73.03

## 2019-10-07 HISTORY — PX: VAGINAL HYSTERECTOMY: SHX2639

## 2019-10-07 HISTORY — DX: Essential (primary) hypertension: I10

## 2019-10-07 HISTORY — DX: Anxiety disorder, unspecified: F41.9

## 2019-10-07 HISTORY — PX: BLADDER SUSPENSION: SHX72

## 2019-10-07 HISTORY — PX: CYSTOSCOPY: SHX5120

## 2019-10-07 LAB — TYPE AND SCREEN
ABO/RH(D): A NEG
Antibody Screen: NEGATIVE

## 2019-10-07 LAB — ABO/RH: ABO/RH(D): A NEG

## 2019-10-07 SURGERY — HYSTERECTOMY, VAGINAL
Anesthesia: General

## 2019-10-07 MED ORDER — DEXAMETHASONE SODIUM PHOSPHATE 10 MG/ML IJ SOLN
INTRAMUSCULAR | Status: AC
  Filled 2019-10-07: qty 1

## 2019-10-07 MED ORDER — LIDOCAINE 2% (20 MG/ML) 5 ML SYRINGE
INTRAMUSCULAR | Status: DC | PRN
  Administered 2019-10-07: 100 mg via INTRAVENOUS

## 2019-10-07 MED ORDER — ACETAMINOPHEN 500 MG PO TABS
1000.0000 mg | ORAL_TABLET | ORAL | Status: AC
  Administered 2019-10-07: 1000 mg via ORAL

## 2019-10-07 MED ORDER — ONDANSETRON 4 MG PO TBDP: 4.0000 mg | ORAL_TABLET | Freq: Four times a day (QID) | ORAL | Status: DC | PRN

## 2019-10-07 MED ORDER — PROPOFOL 10 MG/ML IV BOLUS
INTRAVENOUS | Status: DC | PRN
  Administered 2019-10-07: 200 mg via INTRAVENOUS

## 2019-10-07 MED ORDER — KETOROLAC TROMETHAMINE 15 MG/ML IJ SOLN
15.0000 mg | INTRAMUSCULAR | Status: AC
  Administered 2019-10-07: 30 mg via INTRAVENOUS

## 2019-10-07 MED ORDER — DEXAMETHASONE SODIUM PHOSPHATE 10 MG/ML IJ SOLN
INTRAMUSCULAR | Status: DC | PRN
  Administered 2019-10-07: 10 mg via INTRAVENOUS

## 2019-10-07 MED ORDER — FENTANYL CITRATE (PF) 100 MCG/2ML IJ SOLN
INTRAMUSCULAR | Status: DC | PRN
  Administered 2019-10-07 (×3): 50 ug via INTRAVENOUS
  Administered 2019-10-07: 100 ug via INTRAVENOUS

## 2019-10-07 MED ORDER — DOCUSATE SODIUM 100 MG PO CAPS: 100.0000 mg | ORAL_CAPSULE | Freq: Two times a day (BID) | ORAL | Status: DC

## 2019-10-07 MED ORDER — PANTOPRAZOLE SODIUM 40 MG IV SOLR
INTRAVENOUS | Status: AC
  Filled 2019-10-07: qty 40

## 2019-10-07 MED ORDER — KETOROLAC TROMETHAMINE 15 MG/ML IJ SOLN
INTRAMUSCULAR | Status: AC
  Filled 2019-10-07: qty 1

## 2019-10-07 MED ORDER — GABAPENTIN 300 MG PO CAPS
300.0000 mg | ORAL_CAPSULE | ORAL | Status: AC
  Administered 2019-10-07: 300 mg via ORAL

## 2019-10-07 MED ORDER — METHOCARBAMOL 500 MG PO TABS
500.0000 mg | ORAL_TABLET | Freq: Four times a day (QID) | ORAL | Status: DC | PRN
  Administered 2019-10-07: 500 mg via ORAL

## 2019-10-07 MED ORDER — ESTRADIOL 0.1 MG/GM VA CREA
TOPICAL_CREAM | VAGINAL | Status: DC | PRN
  Administered 2019-10-07: 1 via VAGINAL

## 2019-10-07 MED ORDER — DIPHENHYDRAMINE HCL 50 MG/ML IJ SOLN: 12.5000 mg | Freq: Four times a day (QID) | INTRAMUSCULAR | Status: DC | PRN

## 2019-10-07 MED ORDER — ONDANSETRON HCL 4 MG/2ML IJ SOLN
INTRAMUSCULAR | Status: DC | PRN
  Administered 2019-10-07: 4 mg via INTRAVENOUS

## 2019-10-07 MED ORDER — ROCURONIUM BROMIDE 10 MG/ML (PF) SYRINGE
PREFILLED_SYRINGE | INTRAVENOUS | Status: DC | PRN
  Administered 2019-10-07: 10 mg via INTRAVENOUS
  Administered 2019-10-07: 60 mg via INTRAVENOUS
  Administered 2019-10-07: 10 mg via INTRAVENOUS

## 2019-10-07 MED ORDER — ROCURONIUM BROMIDE 10 MG/ML (PF) SYRINGE
PREFILLED_SYRINGE | INTRAVENOUS | Status: AC
  Filled 2019-10-07: qty 10

## 2019-10-07 MED ORDER — FENTANYL CITRATE (PF) 100 MCG/2ML IJ SOLN
INTRAMUSCULAR | Status: AC
  Filled 2019-10-07: qty 2

## 2019-10-07 MED ORDER — MIDAZOLAM HCL 2 MG/2ML IJ SOLN
INTRAMUSCULAR | Status: AC
  Filled 2019-10-07: qty 2

## 2019-10-07 MED ORDER — DIPHENHYDRAMINE HCL 12.5 MG/5ML PO ELIX: 12.5000 mg | ORAL_SOLUTION | Freq: Four times a day (QID) | ORAL | Status: DC | PRN

## 2019-10-07 MED ORDER — MIDAZOLAM HCL 5 MG/5ML IJ SOLN
INTRAMUSCULAR | Status: DC | PRN
  Administered 2019-10-07: 2 mg via INTRAVENOUS

## 2019-10-07 MED ORDER — SCOPOLAMINE 1 MG/3DAYS TD PT72
1.0000 | MEDICATED_PATCH | TRANSDERMAL | Status: DC
  Administered 2019-10-07: 1.5 mg via TRANSDERMAL

## 2019-10-07 MED ORDER — ORAL CARE MOUTH RINSE: 15.0000 mL | Freq: Once | OROMUCOSAL | Status: DC

## 2019-10-07 MED ORDER — LIDOCAINE 2% (20 MG/ML) 5 ML SYRINGE
INTRAMUSCULAR | Status: AC
  Filled 2019-10-07: qty 5

## 2019-10-07 MED ORDER — OXYCODONE HCL 5 MG PO TABS
ORAL_TABLET | ORAL | Status: AC
  Filled 2019-10-07: qty 2

## 2019-10-07 MED ORDER — SODIUM CHLORIDE 0.9 % IR SOLN
Status: DC | PRN
  Administered 2019-10-07: 1000 mL via INTRAVESICAL

## 2019-10-07 MED ORDER — ONDANSETRON HCL 4 MG/2ML IJ SOLN: 4.0000 mg | Freq: Four times a day (QID) | INTRAMUSCULAR | Status: DC | PRN

## 2019-10-07 MED ORDER — SERTRALINE HCL 100 MG PO TABS
100.0000 mg | ORAL_TABLET | Freq: Every day | ORAL | Status: DC
  Filled 2019-10-07: qty 1

## 2019-10-07 MED ORDER — LACTATED RINGERS IV SOLN: INTRAVENOUS | Status: DC

## 2019-10-07 MED ORDER — CEFAZOLIN SODIUM-DEXTROSE 2-4 GM/100ML-% IV SOLN
INTRAVENOUS | Status: AC
  Filled 2019-10-07: qty 100

## 2019-10-07 MED ORDER — LIDOCAINE 20MG/ML (2%) 15 ML SYRINGE OPTIME
INTRAMUSCULAR | Status: DC | PRN
  Administered 2019-10-07: 1.5 mg/kg/h via INTRAVENOUS

## 2019-10-07 MED ORDER — MEPERIDINE HCL 25 MG/ML IJ SOLN: 6.2500 mg | INTRAMUSCULAR | Status: DC | PRN

## 2019-10-07 MED ORDER — FENTANYL CITRATE (PF) 250 MCG/5ML IJ SOLN
INTRAMUSCULAR | Status: AC
  Filled 2019-10-07: qty 5

## 2019-10-07 MED ORDER — PROMETHAZINE HCL 25 MG/ML IJ SOLN: 6.2500 mg | INTRAMUSCULAR | Status: DC | PRN

## 2019-10-07 MED ORDER — ACETAMINOPHEN 500 MG PO TABS
ORAL_TABLET | ORAL | Status: AC
  Filled 2019-10-07: qty 2

## 2019-10-07 MED ORDER — PROPOFOL 10 MG/ML IV BOLUS
INTRAVENOUS | Status: AC
  Filled 2019-10-07: qty 20

## 2019-10-07 MED ORDER — KETOROLAC TROMETHAMINE 15 MG/ML IJ SOLN
15.0000 mg | Freq: Four times a day (QID) | INTRAMUSCULAR | Status: DC
  Administered 2019-10-07 – 2019-10-08 (×2): 15 mg via INTRAVENOUS

## 2019-10-07 MED ORDER — KETAMINE HCL 10 MG/ML IJ SOLN
INTRAMUSCULAR | Status: DC | PRN
Start: 2019-10-07 — End: 2019-10-07
  Administered 2019-10-07: 25 mg via INTRAVENOUS

## 2019-10-07 MED ORDER — SIMETHICONE 80 MG PO CHEW: 40.0000 mg | CHEWABLE_TABLET | Freq: Four times a day (QID) | ORAL | Status: DC | PRN

## 2019-10-07 MED ORDER — GABAPENTIN 300 MG PO CAPS
ORAL_CAPSULE | ORAL | Status: AC
  Filled 2019-10-07: qty 1

## 2019-10-07 MED ORDER — GABAPENTIN 300 MG PO CAPS
300.0000 mg | ORAL_CAPSULE | Freq: Two times a day (BID) | ORAL | Status: DC
  Administered 2019-10-07: 300 mg via ORAL

## 2019-10-07 MED ORDER — DOCUSATE SODIUM 100 MG PO CAPS
ORAL_CAPSULE | ORAL | Status: AC
  Filled 2019-10-07: qty 1

## 2019-10-07 MED ORDER — SCOPOLAMINE 1 MG/3DAYS TD PT72
MEDICATED_PATCH | TRANSDERMAL | Status: AC
  Filled 2019-10-07: qty 1

## 2019-10-07 MED ORDER — ALPRAZOLAM 0.5 MG PO TABS: 1.0000 mg | ORAL_TABLET | Freq: Two times a day (BID) | ORAL | Status: DC | PRN

## 2019-10-07 MED ORDER — INDIGOTINDISULFONATE SODIUM 8 MG/ML IJ SOLN
INTRAMUSCULAR | Status: DC | PRN
Start: 2019-10-07 — End: 2019-10-07
  Administered 2019-10-07: 5 mL via INTRAVENOUS

## 2019-10-07 MED ORDER — PANTOPRAZOLE SODIUM 40 MG IV SOLR
40.0000 mg | Freq: Every day | INTRAVENOUS | Status: DC
  Administered 2019-10-07: 40 mg via INTRAVENOUS

## 2019-10-07 MED ORDER — FENTANYL CITRATE (PF) 100 MCG/2ML IJ SOLN
25.0000 ug | INTRAMUSCULAR | Status: DC | PRN
  Administered 2019-10-07: 25 ug via INTRAVENOUS

## 2019-10-07 MED ORDER — CHLORHEXIDINE GLUCONATE 0.12 % MT SOLN: 15.0000 mL | Freq: Once | OROMUCOSAL | Status: DC

## 2019-10-07 MED ORDER — KETAMINE HCL 10 MG/ML IJ SOLN
INTRAMUSCULAR | Status: AC
  Filled 2019-10-07: qty 1

## 2019-10-07 MED ORDER — KETOROLAC TROMETHAMINE 30 MG/ML IJ SOLN
INTRAMUSCULAR | Status: AC
  Filled 2019-10-07: qty 1

## 2019-10-07 MED ORDER — OXYCODONE HCL 5 MG PO TABS
5.0000 mg | ORAL_TABLET | ORAL | Status: DC | PRN
  Administered 2019-10-07 – 2019-10-08 (×4): 10 mg via ORAL

## 2019-10-07 MED ORDER — ACETAMINOPHEN 500 MG PO TABS
1000.0000 mg | ORAL_TABLET | Freq: Four times a day (QID) | ORAL | Status: DC
  Administered 2019-10-07 – 2019-10-08 (×3): 1000 mg via ORAL

## 2019-10-07 MED ORDER — ONDANSETRON HCL 4 MG/2ML IJ SOLN
INTRAMUSCULAR | Status: AC
  Filled 2019-10-07: qty 2

## 2019-10-07 MED ORDER — CEFAZOLIN SODIUM-DEXTROSE 2-4 GM/100ML-% IV SOLN
2.0000 g | INTRAVENOUS | Status: AC
  Administered 2019-10-07: 2 g via INTRAVENOUS

## 2019-10-07 MED ORDER — METHOCARBAMOL 500 MG PO TABS
ORAL_TABLET | ORAL | Status: AC
  Filled 2019-10-07: qty 1

## 2019-10-07 MED ORDER — LIDOCAINE-EPINEPHRINE (PF) 1 %-1:200000 IJ SOLN
INTRAMUSCULAR | Status: DC | PRN
  Administered 2019-10-07: 13 mL

## 2019-10-07 SURGICAL SUPPLY — 40 items
ADH SKN CLS APL DERMABOND .7 (GAUZE/BANDAGES/DRESSINGS) ×1
AGENT HMST KT MTR STRL THRMB (HEMOSTASIS)
BLADE SURG 15 STRL LF DISP TIS (BLADE) ×2 IMPLANT
BLADE SURG 15 STRL SS (BLADE) ×9
CANISTER SUCT 3000ML PPV (MISCELLANEOUS) ×3 IMPLANT
COVER WAND RF STERILE (DRAPES) ×3 IMPLANT
DECANTER SPIKE VIAL GLASS SM (MISCELLANEOUS) IMPLANT
DERMABOND ADVANCED (GAUZE/BANDAGES/DRESSINGS) ×2
DERMABOND ADVANCED .7 DNX12 (GAUZE/BANDAGES/DRESSINGS) ×1 IMPLANT
GAUZE PACKING 2X5 YD STRL (GAUZE/BANDAGES/DRESSINGS) ×3 IMPLANT
GLOVE BIO SURGEON STRL SZ7 (GLOVE) ×3 IMPLANT
GLOVE BIOGEL PI IND STRL 6 (GLOVE) ×1 IMPLANT
GLOVE BIOGEL PI IND STRL 7.0 (GLOVE) ×1 IMPLANT
GLOVE BIOGEL PI INDICATOR 6 (GLOVE) ×2
GLOVE BIOGEL PI INDICATOR 7.0 (GLOVE) ×2
GLOVE ECLIPSE 6.0 STRL STRAW (GLOVE) ×3 IMPLANT
GOWN STRL REUS W/TWL LRG LVL3 (GOWN DISPOSABLE) ×9 IMPLANT
KIT TURNOVER CYSTO (KITS) ×3 IMPLANT
LEGGING LITHOTOMY PAIR STRL (DRAPES) ×2 IMPLANT
MARKER SKIN DUAL TIP RULER LAB (MISCELLANEOUS) IMPLANT
NEEDLE HYPO 22GX1.5 SAFETY (NEEDLE) ×3 IMPLANT
NS IRRIG 1000ML POUR BTL (IV SOLUTION) ×3 IMPLANT
PACK VAGINAL WOMENS (CUSTOM PROCEDURE TRAY) ×3 IMPLANT
PAD OB MATERNITY 4.3X12.25 (PERSONAL CARE ITEMS) ×3 IMPLANT
SET IRRIG Y TYPE TUR BLADDER L (SET/KITS/TRAYS/PACK) ×3 IMPLANT
SLING TRANS VAGINAL TAPE (Sling) ×2 IMPLANT
SLING UTERINE/ABD GYNECARE TVT (Sling) ×1 IMPLANT
SURGIFLO W/THROMBIN 8M KIT (HEMOSTASIS) IMPLANT
SUT VIC AB 0 CT1 18XCR BRD8 (SUTURE) ×2 IMPLANT
SUT VIC AB 0 CT1 27 (SUTURE) ×6
SUT VIC AB 0 CT1 27XBRD ANBCTR (SUTURE) ×2 IMPLANT
SUT VIC AB 0 CT1 8-18 (SUTURE) ×15
SUT VIC AB 2-0 CT1 27 (SUTURE)
SUT VIC AB 2-0 CT1 TAPERPNT 27 (SUTURE) IMPLANT
SUT VIC AB 2-0 UR6 27 (SUTURE) IMPLANT
SUT VICRYL 0 TIES 12 18 (SUTURE) ×3 IMPLANT
TOWEL OR 17X26 10 PK STRL BLUE (TOWEL DISPOSABLE) ×6 IMPLANT
TRAY FOLEY W/BAG SLVR 14FR (SET/KITS/TRAYS/PACK) ×3 IMPLANT
UNDERPAD 30X36 HEAVY ABSORB (UNDERPADS AND DIAPERS) ×3 IMPLANT
YANKAUER SUCT BULB TIP NO VENT (SUCTIONS) ×2 IMPLANT

## 2019-10-07 NOTE — Brief Op Note (Signed)
10/07/2019  3:05 PM  PATIENT:  Kaitlin Massey  44 y.o. female  PRE-OPERATIVE DIAGNOSIS:  adenomyosis stress urinary incontinence  POST-OPERATIVE DIAGNOSIS:  adenomyosis stress urinary incontinence  PROCEDURE:  Procedure(s): HYSTERECTOMY VAGINAL (N/A) CYSTOSCOPY (N/A) TRANSVAGINAL TAPE (TVT) PROCEDURE (N/A)  SURGEON:  Surgeon(s) and Role:    * Taam-Akelman, Griselda Miner, MD - Primary    * Carrington Clamp, MD - Assisting  ANESTHESIA:   general  EBL: 375cc  BLOOD ADMINISTERED:none  DRAINS: none   LOCAL MEDICATIONS USED:  LIDOCAINE   SPECIMEN:  Source of Specimen:  uterus and cervix  DISPOSITION OF SPECIMEN:  PATHOLOGY  COUNTS:  YES  TOURNIQUET:  * No tourniquets in log *  DICTATION: .Note written in EPIC  PLAN OF CARE: Admit for overnight observation  PATIENT DISPOSITION:  PACU - hemodynamically stable.   Delay start of Pharmacological VTE agent (>24hrs) due to surgical blood loss or risk of bleeding: not applicable

## 2019-10-07 NOTE — Progress Notes (Signed)
Pt snoring at present when awake states her pain is an 8. Falling back to sleep before meds can be administered. Will continue to monitor.

## 2019-10-07 NOTE — Interval H&P Note (Signed)
History and Physical Interval Note:  10/07/2019 11:32 AM  Kaitlin Massey  has presented today for surgery, with the diagnosis of adenomyosis stress urinary incontinence.  The various methods of treatment have been discussed with the patient and family. After consideration of risks, benefits and other options for treatment, the patient has consented to  Procedure(s): HYSTERECTOMY VAGINAL (N/A) CYSTOSCOPY (N/A) TRANSVAGINAL TAPE (TVT) PROCEDURE (N/A) as a surgical intervention.  The patient's history has been reviewed, patient examined, no change in status, stable for surgery.  I have reviewed the patient's chart and labs.  Questions were answered to the patient's satisfaction.     Dyshaun Bonzo K Taam-Akelman

## 2019-10-07 NOTE — Op Note (Addendum)
OPERATIVE NOTE  PATIENT:  Kaitlin Massey  44 y.o. female  PRE-OPERATIVE DIAGNOSIS:  adenomyosis stress urinary incontinence  POST-OPERATIVE DIAGNOSIS:  adenomyosis stress urinary incontinence  PROCEDURE:  Procedure(s): HYSTERECTOMY VAGINAL (N/A) CYSTOSCOPY (N/A) TRANSVAGINAL TAPE (TVT) PROCEDURE (N/A)  SURGEON:  Surgeon(s) and Role:    * Taam-Akelman, Griselda Miner, MD - Primary    * Carrington Clamp, MD - Assisting  ANESTHESIA:   general  EBL: 375cc  BLOOD ADMINISTERED:none  DRAINS: none   LOCAL MEDICATIONS USED:  LIDOCAINE   SPECIMEN:  Source of Specimen:  uterus and cervix  DISPOSITION OF SPECIMEN:  PATHOLOGY  COUNTS:  YES  TOURNIQUET:  * No tourniquets in log *  PLAN OF CARE: Admit for overnight observation  PATIENT DISPOSITION:  PACU - hemodynamically stable.   Delay start of Pharmacological VTE agent (>24hrs) due to surgical blood loss or risk of bleeding: not applicable  FINDINGS: Exam under anesthesia revealed small mobile uterus. Right fallopian tube normal with fallopian ring from prior tubal, did not visualize right ovary, left ovary or left fallopian tube. On cystoscopy the bladder was intact and bilateral spill was seen from each ureteral orifcace. No needs for the TVT seen on cystoscopy.  DESCRIPTION OF PROCEDURE: After consent was verified, the patient was taken to the operating room where general anesthesia was administered without difficulty.  The patient was placed in the dorsal lithotomy position using allen stirups.  Exam under anesthesia revealed small mobile uterus with minimal descent. The vagina was then prepped and draped in a normal sterile fashion. A foley catheter was placed.  A weighted speculum was placed in the vagina and the Deaver was placed anteriorly. The anterior and posterior lip of the cervix was grasped with a tenaculum.  The cervicovaginal epithelium was injected with 1% lidocaine with epinephrine in a  circumferential fashion.  The epithelium was incised using a 10 blade scalpel circumferentially.  The vaginal tissues on the posterior aspect of the cervix was dissected to mobilize the rectum.  A posterior colpotomy was made with Mayo scissors and the incision was bluntly extended.  The posterior peritoneum was tacked to the vaginal cuff with 0 Vicryl.  The weighted speculum was replaced with a long weighted speculum through the posterior colpotomy to retract the rectum.  The left uterosacral ligament was clamped with a Heaney clamp transected using curved Mayo scissors and suture ligated with 0 Vicryl.  The right uterosacral ligament was clamped cut and tied in a similar fashion.  In a sequential fashion the uterine arteries and cardinal ligaments were clamped cut and tied with 0 Vicryl suture.  Attention was turned anteriorly and dissection of the pubovesicocervical fascia was performed using sharp and blunt dissection.  The anterior peritoneum was identified and sharply entered with Metzenbaum scissors.  This incision was bluntly extended and a retractor was placed in the peritoneal space to retract the bladder anteriorly.  The broad ligament and the uterine ovarian ligaments were clamped cut and tied with 0 Vicryl.  The cervix and uterus were then handed off.  Right fallopian tube visualized with ring from prior tubal, unable to examine the tube completely and decision made to leave fallopian tubes in situ. The right side wall had some oozing and was made hemostatic with a figure of eight. The posterior left sidewall was noted to have some oozing this was made hemostatic with a figure-of-eight. The pedicles were examined and excellent hemostasis was noted. The peritoneum was then closed in a purse-string fashion using 0  Vicryl and incorporating the uterosacrals.     Attention was then turned to the TVT. The mid urethra vagina was grasped with allises superior and inferiorly. The vaginal mucosa was injected  with 1% lidocaine with epi in the midline and a scalpel used to incise the vaginal mucosa vertically.  Allises were used to retract the vaginal mucosa as the vesico-vaginal fascia was removed from the mucosa with blunt and sharp dissection and adequate room midurethral to pelvic floor bilaterally was developed. Hemostasis of the vaginal mucosa was obtained with 2 figure out eights. Two small puncture incisions were then made two cm from midline just above the pubic symphysis.  The abdominal needles from the Gynecare TVT set were introduced behind the pubic bone, through the pelvic floor and out the vagina carefully on each respective side, being careful to hug the posterior of the pubic bone.  The foley was removed and indigo carmine given.  Cystoscopy revealed excellent bilateral spill from each ureteral orifice and no needles in the bladder.   The urethra was clear as well.  The foley was replaced and the vaginal needles attached to the abdominal needles. The tape was pulled into place through the abdominal incisions, the sheaths removed while a kelly was in place under the mesh sling and the tape cut at the level of just below the skin.  The tape was checked and found to be tight enough and laying flat.    Surgiflo was placed in the corners up by the pelvic floor to achieve hemostasis. The vaginal mucosa was trimmed and then closed with a running lock stitch of 2-0 vicryl.    The vaginal cuff was closed horizontally with 0 Vicryl suture in a figure-of-eight stitches.  Good hemostasis was noted. A vaginal packing with estrace was placed.     The patient tolerated the procedure well.  All sponge instrument and needle counts were correct.  The patient was taken to the recovery room in stable condition. Plan for extended stay recovery with removal of vaginal packing and foley catheter in the morning.  Jarielys Girardot K Taam-Akelman 10/07/19 3:42 PM

## 2019-10-07 NOTE — Anesthesia Procedure Notes (Signed)
Procedure Name: Intubation Date/Time: 10/07/2019 12:13 PM Performed by: Jakeb Lamping D, CRNA Pre-anesthesia Checklist: Patient identified, Emergency Drugs available, Suction available and Patient being monitored Patient Re-evaluated:Patient Re-evaluated prior to induction Oxygen Delivery Method: Circle system utilized Preoxygenation: Pre-oxygenation with 100% oxygen Induction Type: IV induction Ventilation: Mask ventilation without difficulty Laryngoscope Size: Mac and 3 Grade View: Grade I Tube type: Oral Tube size: 7.0 mm Number of attempts: 1 Airway Equipment and Method: Stylet Placement Confirmation: ETT inserted through vocal cords under direct vision,  positive ETCO2 and breath sounds checked- equal and bilateral Secured at: 20 cm Tube secured with: Tape Dental Injury: Teeth and Oropharynx as per pre-operative assessment

## 2019-10-07 NOTE — Transfer of Care (Signed)
Immediate Anesthesia Transfer of Care Note  Patient: Kaitlin Massey  Procedure(s) Performed: HYSTERECTOMY VAGINAL (N/A ) CYSTOSCOPY (N/A ) TRANSVAGINAL TAPE (TVT) PROCEDURE (N/A )  Patient Location: PACU  Anesthesia Type:General  Level of Consciousness: awake, alert  and oriented  Airway & Oxygen Therapy: Patient Spontanous Breathing and Patient connected to nasal cannula oxygen  Post-op Assessment: Report given to RN and Post -op Vital signs reviewed and stable  Post vital signs: Reviewed and stable  Last Vitals:  Vitals Value Taken Time  BP 139/71 10/07/19 1518  Temp    Pulse 96 10/07/19 1520  Resp 21 10/07/19 1520  SpO2 98 % 10/07/19 1520  Vitals shown include unvalidated device data.  Last Pain:  Vitals:   10/07/19 1031  TempSrc: Oral  PainSc: 0-No pain      Patients Stated Pain Goal: 4 (10/07/19 1031)  Complications: No complications documented.

## 2019-10-07 NOTE — Anesthesia Postprocedure Evaluation (Signed)
Anesthesia Post Note  Patient: Kaitlin Massey  Procedure(s) Performed: HYSTERECTOMY VAGINAL (N/A ) CYSTOSCOPY (N/A ) TRANSVAGINAL TAPE (TVT) PROCEDURE (N/A )     Patient location during evaluation: PACU Anesthesia Type: General Level of consciousness: awake Pain management: pain level controlled Vital Signs Assessment: post-procedure vital signs reviewed and stable Respiratory status: spontaneous breathing Cardiovascular status: stable Postop Assessment: no apparent nausea or vomiting Anesthetic complications: no   No complications documented.  Last Vitals:  Vitals:   10/07/19 1600 10/07/19 1613  BP: 132/73 139/83  Pulse: 87 95  Resp: 18 20  Temp: 36.8 C 36.7 C  SpO2: 98% 97%    Last Pain:  Vitals:   10/07/19 1613  TempSrc:   PainSc: 10-Worst pain ever   Pain Goal: Patients Stated Pain Goal: 4 (10/07/19 1031)                 Caren Macadam

## 2019-10-07 NOTE — Anesthesia Preprocedure Evaluation (Addendum)
Anesthesia Evaluation  Patient identified by MRN, date of birth, ID band Patient awake    Reviewed: Allergy & Precautions, NPO status , Patient's Chart, lab work & pertinent test results  Airway Mallampati: III  TM Distance: >3 FB Neck ROM: Full    Dental  (+) Dental Advisory Given, Teeth Intact, Chipped   Pulmonary Current Smoker and Patient abstained from smoking.,    Pulmonary exam normal breath sounds clear to auscultation       Cardiovascular hypertension, Pt. on medications Normal cardiovascular exam Rhythm:Regular Rate:Normal     Neuro/Psych PSYCHIATRIC DISORDERS Anxiety Depression negative neurological ROS     GI/Hepatic negative GI ROS, Neg liver ROS,   Endo/Other  Morbid obesity  Renal/GU negative Renal ROS     Musculoskeletal negative musculoskeletal ROS (+)   Abdominal (+) + obese,   Peds  Hematology negative hematology ROS (+)   Anesthesia Other Findings   Reproductive/Obstetrics negative OB ROS                           Anesthesia Physical Anesthesia Plan  ASA: III  Anesthesia Plan: General   Post-op Pain Management:    Induction: Intravenous  PONV Risk Score and Plan: 4 or greater and Ondansetron, Dexamethasone, Midazolam, Scopolamine patch - Pre-op and Treatment may vary due to age or medical condition  Airway Management Planned: Oral ETT and Video Laryngoscope Planned  Additional Equipment: None  Intra-op Plan:   Post-operative Plan: Extubation in OR  Informed Consent: I have reviewed the patients History and Physical, chart, labs and discussed the procedure including the risks, benefits and alternatives for the proposed anesthesia with the patient or authorized representative who has indicated his/her understanding and acceptance.     Dental advisory given  Plan Discussed with: CRNA  Anesthesia Plan Comments: (States she has been off phentermine for "4  or 5 days." Discussed increased perio-operative risk )     Anesthesia Quick Evaluation

## 2019-10-08 ENCOUNTER — Encounter (HOSPITAL_BASED_OUTPATIENT_CLINIC_OR_DEPARTMENT_OTHER): Payer: Self-pay | Admitting: Obstetrics & Gynecology

## 2019-10-08 LAB — CBC
HCT: 34.9 % — ABNORMAL LOW (ref 36.0–46.0)
Hemoglobin: 11.1 g/dL — ABNORMAL LOW (ref 12.0–15.0)
MCH: 27.6 pg (ref 26.0–34.0)
MCHC: 31.8 g/dL (ref 30.0–36.0)
MCV: 86.8 fL (ref 80.0–100.0)
Platelets: 280 10*3/uL (ref 150–400)
RBC: 4.02 MIL/uL (ref 3.87–5.11)
RDW: 13.7 % (ref 11.5–15.5)
WBC: 18.7 10*3/uL — ABNORMAL HIGH (ref 4.0–10.5)
nRBC: 0 % (ref 0.0–0.2)

## 2019-10-08 LAB — BASIC METABOLIC PANEL
Anion gap: 10 (ref 5–15)
BUN: 10 mg/dL (ref 6–20)
CO2: 23 mmol/L (ref 22–32)
Calcium: 8.7 mg/dL — ABNORMAL LOW (ref 8.9–10.3)
Chloride: 108 mmol/L (ref 98–111)
Creatinine, Ser: 0.78 mg/dL (ref 0.44–1.00)
GFR calc Af Amer: >60 mL/min (ref >60)
GFR calc non Af Amer: >60 mL/min (ref >60)
Glucose, Bld: 126 mg/dL — ABNORMAL HIGH (ref 70–99)
Potassium: 4.5 mmol/L (ref 3.5–5.1)
Sodium: 141 mmol/L (ref 135–145)

## 2019-10-08 MED ORDER — OXYCODONE HCL 5 MG PO TABS
ORAL_TABLET | ORAL | Status: AC
  Filled 2019-10-08: qty 2

## 2019-10-08 MED ORDER — OXYCODONE-ACETAMINOPHEN 5-325 MG PO TABS: 1.0000 | ORAL_TABLET | Freq: Four times a day (QID) | ORAL | 0 refills | Status: AC | PRN

## 2019-10-08 MED ORDER — ACETAMINOPHEN 500 MG PO TABS
ORAL_TABLET | ORAL | Status: AC
  Filled 2019-10-08: qty 2

## 2019-10-08 MED ORDER — KETOROLAC TROMETHAMINE 15 MG/ML IJ SOLN
INTRAMUSCULAR | Status: AC
  Filled 2019-10-08: qty 1

## 2019-10-08 MED ORDER — SIMETHICONE 80 MG PO CHEW: 40.0000 mg | CHEWABLE_TABLET | Freq: Four times a day (QID) | ORAL | 0 refills | Status: AC | PRN

## 2019-10-08 MED ORDER — IBUPROFEN 800 MG PO TABS
800.0000 mg | ORAL_TABLET | Freq: Three times a day (TID) | ORAL | 0 refills | Status: AC | PRN
Start: 2019-10-08 — End: ?

## 2019-10-08 MED ORDER — DOCUSATE SODIUM 100 MG PO CAPS
100.0000 mg | ORAL_CAPSULE | Freq: Two times a day (BID) | ORAL | 0 refills | Status: AC
Start: 2019-10-08 — End: ?

## 2019-10-08 NOTE — Progress Notes (Signed)
Patient's vaginal packing was removed. 300cc of sterile water was instilled into patient's bladder and then foley catheter was removed. Patient then urinated 300cc.

## 2019-10-08 NOTE — Discharge Instructions (Signed)
Prescriptions Motrin 800mg every 8 hours for pain Acetaminophen 650mg every 6 hours for moderate pain Percocet (Oxycodone 5mg-acetaminophen 325mg) every 4 hours for severe pain.  Make sure to not exceed acetaminophen 3000mg every day.   Vaginal Hysterectomy, Care After Refer to this sheet in the next few weeks. These instructions provide you with information about caring for yourself after your procedure. Your health care provider may also give you more specific instructions. Your treatment has been planned according to current medical practices, but problems sometimes occur. Call your health care provider if you have any problems or questions after your procedure. What can I expect after the procedure? After the procedure, it is common to have:  Pain.  Soreness and numbness in your incision areas.  Vaginal bleeding and discharge.  Constipation.  Temporary problems emptying the bladder.  Feelings of sadness or other emotions. Follow these instructions at home: Medicines  Take over-the-counter and prescription medicines only as told by your health care provider.  If you were prescribed an antibiotic medicine, take it as told by your health care provider. Do not stop taking the antibiotic even if you start to feel better.  Do not drive or operate heavy machinery while taking prescription pain medicine. Activity  Return to your normal activities as told by your health care provider. Ask your health care provider what activities are safe for you.  Get regular exercise as told by your health care provider. You may be told to take short walks every day and go farther each time.  Do not lift anything that is heavier than 10 lb (4.5 kg). General instructions   Do not put anything in your vagina for 6 weeks after your surgery or as told by your health care provider. This includes tampons and douches.  Do not have sex until your health care provider says you can.  Do not take  baths, swim, or use a hot tub until your health care provider approves.  Drink enough fluid to keep your urine clear or pale yellow.  Do not drive for 24 hours if you were given a sedative.  Keep all follow-up visits as told by your health care provider. This is important. Contact a health care provider if:  Your pain medicine is not helping.  You have a fever.  You have redness, swelling, or pain at your incision site.  You have blood, pus, or a bad-smelling discharge from your vagina.  You continue to have difficulty urinating. Get help right away if:  You have severe abdominal or back pain.  You have heavy bleeding from your vagina.  You have chest pain or shortness of breath. This information is not intended to replace advice given to you by your health care provider. Make sure you discuss any questions you have with your health care provider. Document Revised: 10/14/2015 Document Reviewed: 03/07/2015 Elsevier Patient Education  2020 [employer]  

## 2019-10-08 NOTE — Progress Notes (Signed)
OBGYN Postop note Kaitlin Massey 44 y.o. POD#1 s/p Total vaginal hysterectomy/TVT/cystoscopy S: reports feeling well, pain controlled with PO meds, ambulating, voiding spontaneously since catheter removed this AM. Tolerating PO. Passing flatus.   Vitals:   10/07/19 2029 10/07/19 2230 10/08/19 0236 10/08/19 0641  BP: 136/77 117/78 116/74 125/75  Pulse:  89 77 76  Resp: 16 14 16 16   Temp: 97.8 F (36.6 C) 98.1 F (36.7 C) 97.8 F (36.6 C) 98.1 F (36.7 C)  TempSrc:      SpO2: 98% 96% 98% 97%  Weight:      Height:       Recent Labs    10/08/19 0645  WBC 18.7*  HGB 11.1*  HCT 34.9*  PLT 280    Recent Labs    10/08/19 0645  NA 141  K 4.5  CL 108  BUN 10  CREATININE 0.78  GLUCOSE 126*    Recent Labs    10/08/19 0645  CALCIUM 8.7*   UOP 300cc/h  Abdomen: soft/apporpaitely tender. Incisions from TVT clean/dry.  A/p: POD#1 s/p TVH/TVT. Foley removed, passed voiding trial. Reviewed operative findings, expectations for pain management, importance of regular soft BMs, emptying bladder. Reviewed precautions. Appropriate for discharge Jacee Enerson K Taam-Akelman 10/08/19 7:45 AM

## 2019-10-08 NOTE — Discharge Summary (Signed)
Physician Discharge Summary  Patient ID: Kaitlin Massey MRN: [id number] DOB/AGE: Aug 06, 1975 44 y.o.  Admit date: 10/07/2019 Discharge date: 10/08/2019  Admission Diagnoses: abnormal uterine bleeding  Discharge Diagnoses:  Active Problems:   Abnormal uterine bleeding   Status post hysterectomy Status post tension free vaginal tape  Discharged Condition: good  Hospital Course:  The patient was admitted through pre-op holding where her consent was reviewed and all questions were answered. She was taken to the operating room by attending surgeon Dr. Pollie Meyer. She underwent an uncomplicated total vaginal hysterectomy, tension free vaginal tape, cystoscopy. Please see operative dictation for further details. Following her surgery she was taken to the PACU for recovery and then transferred to extended recovery. On the morning of POD#1 her foley was removed, and she passed a voiding trial (300cc back filled and voided 300cc). She was tolerating a regular diet, her pain was controlled on po pain medications, she was ambulating without assistance and voiding spontaneously. By POD#1, she was meeting all goals and deemed stable for discharge.  Consults: None  Significant Diagnostic Studies:  Recent Labs    10/08/19 0645  WBC 18.7*  HGB 11.1*  HCT 34.9*  PLT 280  NA 141  K 4.5  CL 108  BUN 10  CREATININE 0.78   Treatments: surgery: TVH/TVT/Cystoscopy Discharge Exam: Blood pressure 125/75, pulse 76, temperature 98.1 F (36.7 C), resp. rate 16, height 5' (1.524 m), weight 98.3 kg, last menstrual period 09/19/2019, SpO2 97 %, unknown if currently breastfeeding. Resting comfortably Abdomen: soft/apporpaitely tender. Incisions from TVT clean/dry.  Disposition: Discharge disposition: 01-Home or Self Care        Discharge Instructions    Call MD for:  difficulty breathing, headache or visual disturbances   Complete by: As directed    Call MD for:  extreme fatigue   Complete by:  As directed    Call MD for:  hives   Complete by: As directed    Call MD for:  persistant dizziness or light-headedness   Complete by: As directed    Call MD for:  persistant nausea and vomiting   Complete by: As directed    Call MD for:  redness, tenderness, or signs of infection (pain, swelling, redness, odor or green/yellow discharge around incision site)   Complete by: As directed    Call MD for:  severe uncontrolled pain   Complete by: As directed    Call MD for:  temperature >100.4   Complete by: As directed    Diet - low sodium heart healthy   Complete by: As directed    Driving Restrictions   Complete by: As directed    No driving while taking percocet   Increase activity slowly   Complete by: As directed    Lifting restrictions   Complete by: As directed    No lifting >10 lbs for 6 weeks   No wound care   Complete by: As directed    Sexual Activity Restrictions   Complete by: As directed    Nothing in the vagina x 6weeks     Allergies as of 10/08/2019      Reactions   Morphine Anaphylaxis      Medication List    STOP taking these medications   prenatal multivitamin Tabs tablet     TAKE these medications   ALPRAZolam 1 MG tablet Commonly known as: XANAX Take 1 mg by mouth 2 (two) times daily as needed for anxiety.   docusate sodium 100 MG capsule  Commonly known as: COLACE Take 1 capsule (100 mg total) by mouth 2 (two) times daily.   ibuprofen 800 MG tablet Commonly known as: ADVIL Take 1 tablet (800 mg total) by mouth every 8 (eight) hours as needed. What changed:   medication strength  how much to take  when to take this  reasons to take this   oxyCODONE-acetaminophen 5-325 MG tablet Commonly known as: Percocet Take 1 tablet by mouth every 6 (six) hours as needed for severe pain.   phentermine 37.5 MG tablet Commonly known as: ADIPEX-P Take 37.5 mg by mouth daily.   sertraline 100 MG tablet Commonly known as: ZOLOFT Take 100 mg by mouth  daily.   simethicone 80 MG chewable tablet Commonly known as: MYLICON Chew 0.5 tablets (40 mg total) by mouth every 6 (six) hours as needed for flatulence (bloating).       Follow-up Information    Taam-Akelman, Griselda Miner, MD. Schedule an appointment as soon as possible for a visit in 2 week(s).   Specialty: Obstetrics and Gynecology Contact information: [address] [place] [address] [place] [address] [phone]               Signed: Rande Brunt 10/08/2019, 7:50 AM

## 2019-10-10 LAB — SURGICAL PATHOLOGY

## 2019-10-17 NOTE — Addendum Note (Signed)
Addendum  created 10/17/19 0926 by Lewie Loron, MD   [facility]procedure Staff edited
